# Patient Record
Sex: Female | Born: 1955
Health system: Southern US, Community
[De-identification: ages and names within clinical notes are randomized; demographics above are authoritative.]

## PROBLEM LIST (undated history)

## (undated) DIAGNOSIS — Z8541 Personal history of malignant neoplasm of cervix uteri: Secondary | ICD-10-CM

## (undated) DIAGNOSIS — M2550 Pain in unspecified joint: Secondary | ICD-10-CM

## (undated) DIAGNOSIS — R202 Paresthesia of skin: Secondary | ICD-10-CM

## (undated) DIAGNOSIS — M5116 Intervertebral disc disorders with radiculopathy, lumbar region: Secondary | ICD-10-CM

## (undated) DIAGNOSIS — G8929 Other chronic pain: Secondary | ICD-10-CM

## (undated) DIAGNOSIS — M5136 Other intervertebral disc degeneration, lumbar region: Secondary | ICD-10-CM

## (undated) DIAGNOSIS — M5126 Other intervertebral disc displacement, lumbar region: Secondary | ICD-10-CM

## (undated) DIAGNOSIS — E785 Hyperlipidemia, unspecified: Secondary | ICD-10-CM

## (undated) DIAGNOSIS — R200 Anesthesia of skin: Secondary | ICD-10-CM

## (undated) HISTORY — DX: Personal history of malignant neoplasm of cervix uteri: Z85.41

## (undated) HISTORY — PX: EYE SURGERY: SHX253

## (undated) HISTORY — DX: Pain in unspecified joint: M25.50

## (undated) HISTORY — DX: Other chronic pain: G89.29

## (undated) HISTORY — PX: HAND SURGERY: SHX662

## (undated) HISTORY — DX: Anesthesia of skin: R20.0

## (undated) HISTORY — DX: Paresthesia of skin: R20.2

## (undated) HISTORY — DX: Hyperlipidemia, unspecified: E78.5

## (undated) HISTORY — DX: Intervertebral disc disorders with radiculopathy, lumbar region: M51.16

---
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
## 1986-04-23 DIAGNOSIS — Z8541 Personal history of malignant neoplasm of cervix uteri: Secondary | ICD-10-CM

## 1986-04-23 HISTORY — DX: Personal history of malignant neoplasm of cervix uteri: Z85.41

## 2009-06-22 ENCOUNTER — Encounter: Admission: RE | Admit: 2009-06-22 | Discharge: 2009-06-22 | Payer: Self-pay | Admitting: Obstetrics and Gynecology

## 2009-08-04 ENCOUNTER — Encounter (INDEPENDENT_AMBULATORY_CARE_PROVIDER_SITE_OTHER): Payer: Self-pay | Admitting: *Deleted

## 2009-09-02 ENCOUNTER — Ambulatory Visit: Payer: Self-pay | Admitting: Internal Medicine

## 2009-10-28 ENCOUNTER — Ambulatory Visit: Payer: Self-pay | Admitting: Internal Medicine

## 2010-03-31 LAB — HM MAMMOGRAPHY

## 2010-03-31 LAB — HM PAP SMEAR

## 2010-05-14 ENCOUNTER — Encounter: Payer: Self-pay | Admitting: Obstetrics and Gynecology

## 2010-05-23 NOTE — Letter (Signed)
Summary: Referral - not able to see patient  [facility]  [address] [place], [place] [address]   Phone: [phone]  Fax: [phone]        August 04, 2009    [facility] Waynard Reeds, M.D. [address] [place], [place] [address]    Re:   Kim Edwards DOB:  26-Apr-1955 MRN:   [id number]    Dear Dr. Tenny Craw:  Thank you for your kind referral of the above patient.  We have attempted to schedule the recommended procedure Screening Colonoscopy but have not been able to schedule because:   X  The patient was not available by phone and/or has not returned our calls.  ___ The patient declined to schedule the procedure at this time.  We appreciate the referral and hope that we will have the opportunity to treat this patient in the future.    Sincerely,    [facility] [phone]

## 2010-05-24 ENCOUNTER — Other Ambulatory Visit: Payer: Self-pay | Admitting: Family Medicine

## 2010-05-24 ENCOUNTER — Encounter: Payer: Self-pay | Admitting: Family Medicine

## 2010-05-24 ENCOUNTER — Ambulatory Visit: Admit: 2010-05-24 | Payer: Self-pay | Admitting: Family Medicine

## 2010-05-24 ENCOUNTER — Ambulatory Visit (INDEPENDENT_AMBULATORY_CARE_PROVIDER_SITE_OTHER): Payer: 59 | Admitting: Family Medicine

## 2010-05-24 ENCOUNTER — Telehealth (INDEPENDENT_AMBULATORY_CARE_PROVIDER_SITE_OTHER): Payer: Self-pay | Admitting: *Deleted

## 2010-05-24 DIAGNOSIS — N959 Unspecified menopausal and perimenopausal disorder: Secondary | ICD-10-CM

## 2010-05-24 DIAGNOSIS — Z8541 Personal history of malignant neoplasm of cervix uteri: Secondary | ICD-10-CM | POA: Insufficient documentation

## 2010-05-24 DIAGNOSIS — F329 Major depressive disorder, single episode, unspecified: Secondary | ICD-10-CM

## 2010-05-24 DIAGNOSIS — E785 Hyperlipidemia, unspecified: Secondary | ICD-10-CM | POA: Insufficient documentation

## 2010-05-24 DIAGNOSIS — Z0000 Encounter for general adult medical examination without abnormal findings: Secondary | ICD-10-CM

## 2010-05-24 DIAGNOSIS — F419 Anxiety disorder, unspecified: Secondary | ICD-10-CM | POA: Insufficient documentation

## 2010-05-24 DIAGNOSIS — F411 Generalized anxiety disorder: Secondary | ICD-10-CM

## 2010-05-24 LAB — LIPID PANEL
Cholesterol: 239 mg/dL — ABNORMAL HIGH (ref 0–200)
HDL: 65.1 mg/dL (ref >39.00)
Triglycerides: 403 mg/dL — ABNORMAL HIGH (ref 0.0–149.0)

## 2010-05-24 LAB — LDL CHOLESTEROL, DIRECT: Direct LDL: 133.9 mg/dL

## 2010-05-24 LAB — HEPATIC FUNCTION PANEL
ALT: 16 U/L (ref 0–35)
Albumin: 3.9 g/dL (ref 3.5–5.2)
Bilirubin, Direct: 0.1 mg/dL (ref 0.0–0.3)

## 2010-05-25 ENCOUNTER — Encounter: Payer: Self-pay | Admitting: Family Medicine

## 2010-05-26 ENCOUNTER — Telehealth (INDEPENDENT_AMBULATORY_CARE_PROVIDER_SITE_OTHER): Payer: Self-pay | Admitting: *Deleted

## 2010-05-31 NOTE — Assessment & Plan Note (Signed)
Summary: NEW TO EST/PH/CONSULT   Vital Signs:  Patient profile:   55 year old female Height:      62 inches Weight:      150 pounds BMI:     27.53 Pulse rate:   66 / minute BP sitting:   100 / 60  (left arm)  Vitals Entered By: Doristine Devoid CMA (May 24, 2010 10:40 AM) CC: NEW EST- refill on med    History of Present Illness: 55 yo woman here today to establish care.  previous MD- [facility]  GYN- Ross  Hyperlipidemia- had blood work done for recent life insurance policy and found counts were 'sky high'.  was started on Lipitor 40mg , has run out of meds.  tolerated statin w/out difficulty- denies abd pain, N/V, myalgias.  menopausal- LMP 4 months ago.  having hot flashes- but these have improved in last few months.  'i'm crazy w/ the emotional stuff'.  feeling overwhelmed w/ work, Public affairs consultant, responsibilities.  decreased sex drive.  'short fuse'.  intermittantly tearful.  denies withdrawing from people- 'i just don't have time'.  not sleeping well.  denies SI/HI.  + fatigue.  some palpitations when overwhelmed but denies SOB.  Preventive Screening-Counseling & Management  Alcohol-Tobacco     Alcohol drinks/day: 1     Alcohol type: wine     Smoking Status: quit  Caffeine-Diet-Exercise     Does Patient Exercise: no      Sexual History:  currently monogamous.        Drug Use:  never.    Current Medications (verified): 1)  Lipitor 40 Mg Tabs (Atorvastatin Calcium) .... Take One Tablet Daliy  Allergies (verified): 1)  ! Percocet  Past History:  Past Medical History: Hyperlipidemia Cervical cancer, hx of  Past Surgical History: cervical cancer  Family History: CAD-mother quad bypass-pacemaker,father-bypass X2 HTN-no DM-mother,father STROKE-no COLON CA-no BREAST CA-no Hyperlipidemia- brother, sister  Social History: lesbian, 3 children (2009, 1999, 65) Little Therapist, nutritional- runs [employer] briefly smoked in past, not current wine dailySmoking Status:   quit Does Patient Exercise:  no Sexual History:  currently monogamous Drug Use:  never  Review of Systems      See HPI  Physical Exam  General:  Well-developed,well-nourished,in no acute distress; alert,appropriate and cooperative throughout examination Head:  Normocephalic and atraumatic without obvious abnormalities. No apparent alopecia or balding. Eyes:  PERRL, EOMI Neck:  No deformities, masses, or tenderness noted. Lungs:  Normal respiratory effort, chest expands symmetrically. Lungs are clear to auscultation, no crackles or wheezes. Heart:  Normal rate and regular rhythm. S1 and S2 normal without gallop, murmur, click, rub or other extra sounds. Abdomen:  soft, NT/ND, +BS Pulses:  +2 carotid, radial, DP Extremities:  no C/C/E Neurologic:  alert & oriented X3, cranial nerves II-XII intact, gait normal, and DTRs symmetrical and normal.   Skin:  turgor normal and color normal.   Cervical Nodes:  No lymphadenopathy noted Psych:  rapid speech, linear thought process, anxious but not depressed appearing   Impression & Recommendations:  Problem # 1:  HYPERLIPIDEMIA (ICD-272.4) Assessment New check labs to assess starting point.  may need to adjust meds based on levels.  tolerated statin w/out difficulty. Her updated medication list for this problem includes:    Lipitor 40 Mg Tabs (Atorvastatin calcium) .Marland Kitchen... Take one tablet daliy  Orders: Venipuncture (04540) Specimen Handling (98119) TLB-Lipid Panel (80061-LIPID) TLB-Hepatic/Liver Function Pnl (80076-HEPATIC)  Problem # 2:  PERIMENOPAUSAL SYNDROME (ICD-627.9) Assessment: New pt having perimenopausal sxs.  not officially menopausal since LMP 4 months ago.  start Effexor to control both hot flashes and 'emotional rollar coaster'.  Problem # 3:  ANXIETY STATE, UNSPECIFIED (ICD-300.00) Assessment: New pt having difficulty maintaining balance between work and home and is feeling overwhelmed.  this is weighing her down and  causing fatigue.  start SNRI to improve sxs of fatigue and to minimize sexual dysfxn as pt reports low libido.  will follow closely. Her updated medication list for this problem includes:    Venlafaxine Hcl 37.5 Mg Xr24h-tab (Venlafaxine hcl) .Marland Kitchen... 1 tab by mouth daily x2 weeks and then increase to 2 tabs daily  Complete Medication List: 1)  Lipitor 40 Mg Tabs (Atorvastatin calcium) .... Take one tablet daliy 2)  Venlafaxine Hcl 37.5 Mg Xr24h-tab (Venlafaxine hcl) .Marland Kitchen.. 1 tab by mouth daily x2 weeks and then increase to 2 tabs daily  Patient Instructions: 1)  Follow up in 1 month to recheck mood 2)  Start the Venlafaxine daily for the mood 3)  Restart the lipitor 4)  We'll notify you of your lab results 5)  Call with any questions or concerns 6)  Welcome!  We're glad to have you! Prescriptions: LIPITOR 40 MG TABS (ATORVASTATIN CALCIUM) take one tablet daliy  #30 x 6   Entered and Authorized by:   Neena Rhymes MD   Signed by:   Neena Rhymes MD on 05/24/2010   Method used:   [facility]  [address] [id number]       [address]       [place], [place]  [address]       Ph: [phone] or [phone]       Fax: [phone]   RxID:   [id number] VENLAFAXINE HCL 37.5 MG XR24H-TAB (VENLAFAXINE HCL) 1 tab by mouth daily x2 weeks and then increase to 2 tabs daily  #60 x 1   Entered and Authorized by:   Neena Rhymes MD   Signed by:   Neena Rhymes MD on 05/24/2010   Method used:   [facility]        [facility]  [address] [id number]       [address]       [place], [place]  [address]       Ph: [phone] or [phone]       Fax: [phone]   RxID:   [id number]    Orders Added: 1)  Venipuncture [37628] 2)  Specimen Handling [99000] 3)  TLB-Lipid Panel [80061-LIPID] 4)  TLB-Hepatic/Liver Function Pnl [80076-HEPATIC] 5)  New Patient Level III [31517]

## 2010-06-03 ENCOUNTER — Encounter: Payer: Self-pay | Admitting: Family Medicine

## 2010-06-08 NOTE — Progress Notes (Signed)
Summary: Prior Auth- Denied-MED CHANGED TO [facility] XR-[facility]  Phone Note Refill Request Call back at [phone] Message from:  Pharmacy on May 24, 2010 11:23 AM  Refills Requested: Medication #1:  VENLAFAXINE HCL 37.5 MG XR24H-TAB 1 tab by mouth daily x2 weeks and then increase to 2 tabs daily.   Dosage confirmed as above?Dosage Confirmed [facility] [address]   Initial call taken by: Harold Barban,  May 24, 2010 11:23 AM  Follow-up for Phone Call        awaiting fax............Marland KitchenFelecia Deloach CMA  May 24, 2010 3:31 PM   Prior auth faxed awaitng response......Marland KitchenFelecia Deloach CMA  May 25, 2010 5:01 PM   Additional Follow-up for Phone Call Additional follow up Details #1::        Prior auth denied coverage is proved insituations where we are unable to approve your request for coverage of VENLAFAXINE HCL. formulary alternative VENLAFAXINE ER or Effexor XR......Marland KitchenFelecia Deloach CMA  May 29, 2010 11:28 AM     Additional Follow-up for Phone Call Additional follow up Details #2::    switch to Effexor XR 37.5mg - same directions. Follow-up by: Neena Rhymes MD,  May 29, 2010 12:08 PM  Additional Follow-up for Phone Call Additional follow up Details #3:: Details for Additional Follow-up Action Taken: left message on machine ....Marland KitchenMarland KitchenDoristine Devoid CMA  May 30, 2010 2:48 PM   Pt aware of med change and Rx sent to pharmacy..........Marland KitchenFelecia Deloach CMA  May 30, 2010 3:43 PM   New/Updated Medications: EFFEXOR XR 37.5 MG XR24H-CAP (VENLAFAXINE HCL) Take 1 tab by mouth daily x2 weeks and then increase to 2 tabs daily Prescriptions: EFFEXOR XR 37.5 MG XR24H-CAP (VENLAFAXINE HCL) Take 1 tab by mouth daily x2 weeks and then increase to 2 tabs daily  #60 x 1   Entered by:   Jeremy Johann CMA   Authorized by:   Neena Rhymes MD   Signed by:   Jeremy Johann CMA on 05/30/2010   Method used:   [facility] ...       [facility]  [address]  [id number]       [address]       [place], [place]  [address]       Ph: [phone] or [phone]       Fax: [phone]   RxID:   [id number]

## 2010-06-08 NOTE — Progress Notes (Signed)
Summary: labs-lmom  Phone Note Outgoing Call   Call placed by: Doristine Devoid CMA,  May 26, 2010 8:40 AM Call placed to: Patient Summary of Call: triglycerides are very high.  will need to add Fenofibrate 160mg  daily to improve this.  should continue Lipitor 40mg  nightly.  recheck LFTs at next visit after adding Fenofibrate.  Follow-up for Phone Call        left message on machine .......Marland KitchenDoristine Devoid CMA  May 26, 2010 8:40 AM   Discuss with patient...Marland KitchenMarland KitchenFelecia Deloach CMA  May 29, 2010 10:50 AM     New/Updated Medications: FENOFIBRATE 160 MG TABS (FENOFIBRATE) Take 1 tab daily Prescriptions: FENOFIBRATE 160 MG TABS (FENOFIBRATE) Take 1 tab daily  #30 x 2   Entered by:   Jeremy Johann CMA   Authorized by:   Neena Rhymes MD   Signed by:   Jeremy Johann CMA on 05/29/2010   Method used:   [facility] ...       [facility]  [address] [id number]       [address]       [place], [place]  [address]       Ph: [phone] or [phone]       Fax: [phone]   RxID:   [id number]

## 2010-06-14 NOTE — Medication Information (Signed)
Summary: Prior Authorization Request for Venlafaxine Hcl Er  Prior Authorization Request for Venlafaxine Hcl Er   Imported By: Maryln Gottron 06/08/2010 11:00:01  _____________________________________________________________________  External Attachment:    Type:   Image     Comment:   External Document

## 2010-06-20 NOTE — Medication Information (Signed)
Summary: Venlafaxine Hcl Denied  Venlafaxine Hcl Denied   Imported By: Maryln Gottron 06/13/2010 13:57:02  _____________________________________________________________________  External Attachment:    Type:   Image     Comment:   External Document

## 2010-06-22 ENCOUNTER — Ambulatory Visit (INDEPENDENT_AMBULATORY_CARE_PROVIDER_SITE_OTHER): Payer: 59 | Admitting: Family Medicine

## 2010-06-22 ENCOUNTER — Other Ambulatory Visit: Payer: Self-pay | Admitting: Family Medicine

## 2010-06-22 ENCOUNTER — Telehealth (INDEPENDENT_AMBULATORY_CARE_PROVIDER_SITE_OTHER): Payer: Self-pay | Admitting: *Deleted

## 2010-06-22 ENCOUNTER — Encounter: Payer: Self-pay | Admitting: Family Medicine

## 2010-06-22 DIAGNOSIS — E785 Hyperlipidemia, unspecified: Secondary | ICD-10-CM

## 2010-06-22 DIAGNOSIS — M25519 Pain in unspecified shoulder: Secondary | ICD-10-CM

## 2010-06-22 DIAGNOSIS — F411 Generalized anxiety disorder: Secondary | ICD-10-CM

## 2010-06-22 LAB — HEPATIC FUNCTION PANEL
ALT: 43 U/L — ABNORMAL HIGH (ref 0–35)
AST: 50 U/L — ABNORMAL HIGH (ref 0–37)
Albumin: 4.2 g/dL (ref 3.5–5.2)
Alkaline Phosphatase: 25 U/L — ABNORMAL LOW (ref 39–117)
Bilirubin, Direct: 0.1 mg/dL (ref 0.0–0.3)
Total Bilirubin: 0.6 mg/dL (ref 0.3–1.2)
Total Protein: 6.9 g/dL (ref 6.0–8.3)

## 2010-06-28 ENCOUNTER — Telehealth (INDEPENDENT_AMBULATORY_CARE_PROVIDER_SITE_OTHER): Payer: Self-pay | Admitting: *Deleted

## 2010-06-28 ENCOUNTER — Ambulatory Visit: Payer: 59 | Admitting: Family Medicine

## 2010-06-29 NOTE — Assessment & Plan Note (Signed)
Summary: 1 month f/u/cbs   Vital Signs:  Patient profile:   55 year old female Height:      62 inches (157.48 cm) Weight:      150.25 pounds (68.30 kg) BMI:     27.58 Temp:     98.5 degrees F (36.94 degrees C) oral BP sitting:   126 / 80  (left arm) Cuff size:   regular  Vitals Entered By: Lucious Groves CMA (June 22, 2010 10:55 AM) CC: 1 mo. followup./kb   History of Present Illness: 55 yo woman here today for f/u on mood.  insurance never approved the Effexor so never started med.  Hyperlipidemia- doing well on lipitor and fenofibrate.  no N/V, abd pain, myalgias.  bilateral shoulder pain- L>R, having difficulty lying on L shoulder due to pain.  improves w/ tylenol.  + family hx of arthritis. will also have pain in L elbow and occasionally L wrist.  been present for over a year but really noticeable last 3-4 months.  Current Medications (verified): 1)  Lipitor 40 Mg Tabs (Atorvastatin Calcium) .... Take One Tablet Daliy 2)  Effexor Xr 37.5 Mg Xr24h-Cap (Venlafaxine Hcl) .... Take 1 Tab By Mouth Daily X2 Weeks and Then Increase To 2 Tabs Daily 3)  Fenofibrate 160 Mg Tabs (Fenofibrate) .... Take 1 Tab Daily  Allergies (verified): 1)  ! Percocet  Review of Systems      See HPI  Physical Exam  General:  Well-developed,well-nourished,in no acute distress; alert,appropriate and cooperative throughout examination Lungs:  Normal respiratory effort, chest expands symmetrically. Lungs are clear to auscultation, no crackles or wheezes. Heart:  Normal rate and regular rhythm. S1 and S2 normal without gallop, murmur, click, rub or other extra sounds. Abdomen:  soft, NT/ND, +BS Msk:  + TTP over L biceps head, pain w/ suppination, good ROM of L shoulder Pulses:  +2 radial, ulnar Extremities:  no C/C/E   Impression & Recommendations:  Problem # 1:  ANXIETY STATE, UNSPECIFIED (ICD-300.00) Assessment Unchanged never started meds.  prescription re-sent.  pt told to call today if there  was a problem getting meds. Her updated medication list for this problem includes:    Effexor Xr 37.5 Mg Xr24h-cap (Venlafaxine hcl) .Marland Kitchen... Take 1 tab by mouth daily x2 weeks and then increase to 2 tabs daily  Problem # 2:  HYPERLIPIDEMIA (ICD-272.4) Assessment: Unchanged doing well on current meds.  recheck LFTs Her updated medication list for this problem includes:    Lipitor 40 Mg Tabs (Atorvastatin calcium) .Marland Kitchen... Take one tablet daliy    Fenofibrate 160 Mg Tabs (Fenofibrate) .Marland Kitchen... Take 1 tab daily  Orders: Venipuncture (29562) TLB-Hepatic/Liver Function Pnl (80076-HEPATIC) Specimen Handling (13086)  Problem # 3:  SHOULDER, PAIN (ICD-719.41) Assessment: New  continue NSAIDs.  refer to sports med as this has been progressively worsening.  Orders: Sports Medicine (Sports Med)  Complete Medication List: 1)  Lipitor 40 Mg Tabs (Atorvastatin calcium) .... Take one tablet daliy 2)  Effexor Xr 37.5 Mg Xr24h-cap (Venlafaxine hcl) .... Take 1 tab by mouth daily x2 weeks and then increase to 2 tabs daily 3)  Fenofibrate 160 Mg Tabs (Fenofibrate) .... Take 1 tab daily  Patient Instructions: 1)  We'll notify you of your sports med appt 2)  Continue the tylenol or ibuprofen/aleve as needed for pain 3)  Continue the cholesterol meds- we'll notify you of your labs 4)  Please call me if Effexor is not available 5)  Call with any questions or concerns 6)  Happy Spring!!! Prescriptions: EFFEXOR XR 37.5 MG XR24H-CAP (VENLAFAXINE HCL) Take 1 tab by mouth daily x2 weeks and then increase to 2 tabs daily  #60 x 1   Entered and Authorized by:   Neena Rhymes MD   Signed by:   Neena Rhymes MD on 06/22/2010   Method used:   [facility]        [facility]  [address] [id number]       [address]       [place], [place]  [address]       Ph: [phone] or [phone]       Fax: [phone]   RxID:   [id number]    Orders Added: 1)  Venipuncture  [34742] 2)  TLB-Hepatic/Liver Function Pnl [80076-HEPATIC] 3)  Specimen Handling [99000] 4)  Est. Patient Level III [59563] 5)  Sports Medicine [Sports Med]

## 2010-07-04 NOTE — Progress Notes (Signed)
Summary: No prior auth needed  [facility]  Phone Note Refill Request Message from:  Fax from Pharmacy on June 28, 2010 3:25 PM  Refills Requested: Medication #1:  EFFEXOR XR 37.5 MG XR24H-CAP Take 1 tab by mouth daily prior auth - [id number] - id [id number]  Initial call taken by: Okey Regal [place],  June 28, 2010 3:26 PM  Follow-up for Phone Call        Spoke to pharmacy paid claim today for med so we can disregard fax......Marland KitchenFelecia Deloach CMA  June 28, 2010 4:31 PM

## 2010-07-04 NOTE — Progress Notes (Signed)
Summary: --Venlafaxine refill---needs prior auth  Phone Note Refill Request Message from:  Fax from Pharmacy on June 22, 2010 3:46 PM  Refills Requested: Medication #1:  EFFEXOR XR 37.5 MG XR24H-CAP Take 1 tab by mouth daily x2 weeks and then increase to 2 tabs daily   Dosage confirmed as above?[facility]irmed [id number] [address] [place], [place]  [address]      phone = [phone]   fax = [phone]    qty = 60     needs prior auth-- [phone], patient ID# [id number]  Next Appointment Scheduled: none Initial call taken by: Jerolyn Shin,  June 22, 2010 3:48 PM  Follow-up for Phone Call        Per insurance 37.5 mg is only covered for once daily dosing. If Pt is needed more a appeal will need to be submitted for quantity override. However 75 mg is covered for up to 3 tabs daily dosing. Pls advise if ok to change med or appeal is appropriate.......Marland KitchenFelecia Deloach CMA  June 26, 2010 9:16 AM   Additional Follow-up for Phone Call Additional follow up Details #1::        37.5mg  daily x30 w/ 3 refills.  should schedule an appt in 1 month to check on mood.  if mood is no better will increase to 75mg  tabs.  this should avoid prior auth. Additional Follow-up by: Neena Rhymes MD,  June 26, 2010 9:29 AM    Additional Follow-up for Phone Call Additional follow up Details #2::    Left message to call office.............Marland KitchenFelecia Deloach CMA  June 26, 2010 10:00 AM  Pt aware appt scheduled..........Marland KitchenFelecia Deloach CMA  June 26, 2010 12:41 PM   New/Updated Medications: EFFEXOR XR 37.5 MG XR24H-CAP (VENLAFAXINE HCL) Take 1 tab by mouth daily Prescriptions: EFFEXOR XR 37.5 MG XR24H-CAP (VENLAFAXINE HCL) Take 1 tab by mouth daily  #30 x 3   Entered by:   Jeremy Johann CMA   Authorized by:   Neena Rhymes MD   Signed by:   Jeremy Johann CMA on 06/26/2010   Method used:   [facility] ...       [facility]  [address] [id number]       [address]       [place], [place]  [address]       Ph: [phone] or [phone]       Fax: [phone]   RxID:   [id number]

## 2010-07-26 ENCOUNTER — Encounter: Payer: Self-pay | Admitting: Family Medicine

## 2010-07-26 ENCOUNTER — Ambulatory Visit (INDEPENDENT_AMBULATORY_CARE_PROVIDER_SITE_OTHER): Payer: 59 | Admitting: Family Medicine

## 2010-07-26 ENCOUNTER — Ambulatory Visit: Payer: 59 | Admitting: Family Medicine

## 2010-07-26 VITALS — BP 113/78 | HR 69 | Temp 97.4°F | Ht 62.0 in | Wt 150.2 lb

## 2010-07-26 VITALS — BP 100/70 | Temp 98.1°F | Ht 62.0 in | Wt 150.5 lb

## 2010-07-26 DIAGNOSIS — M25519 Pain in unspecified shoulder: Secondary | ICD-10-CM

## 2010-07-26 DIAGNOSIS — R7989 Other specified abnormal findings of blood chemistry: Secondary | ICD-10-CM | POA: Insufficient documentation

## 2010-07-26 DIAGNOSIS — R945 Abnormal results of liver function studies: Secondary | ICD-10-CM

## 2010-07-26 DIAGNOSIS — F411 Generalized anxiety disorder: Secondary | ICD-10-CM

## 2010-07-26 LAB — HEPATIC FUNCTION PANEL
ALT: 37 U/L — ABNORMAL HIGH (ref 0–35)
Total Protein: 6.7 g/dL (ref 6.0–8.3)

## 2010-07-26 MED ORDER — BUSPIRONE HCL 15 MG PO TABS: 15.0000 mg | ORAL_TABLET | Freq: Two times a day (BID) | ORAL | Status: AC

## 2010-07-26 NOTE — Progress Notes (Signed)
  Subjective:    Patient ID: Kim Edwards, female    DOB: 08-08-1955, 55 y.o.   MRN: [id number]  HPI Anxiety state- w/in 1 week was unable to have orgasm.  Stopped meds after 4 doses.  Still struggling w/ anxiety and very stressful.  Attempting to exercise 2-3x/week.  Abnormal LFTs- mildly elevated last check.  Repeat today to trend.   Review of Systems For ROS see HPI     Objective:   Physical Exam  Constitutional: She appears well-developed and well-nourished. No distress.  Psychiatric: Her speech is normal and behavior is normal. Judgment and thought content normal. Her mood appears not anxious. Her affect is not angry, not blunt, not labile and not inappropriate. Cognition and memory are normal. She does not exhibit a depressed mood.          Assessment & Plan:

## 2010-07-26 NOTE — Patient Instructions (Signed)
You have rotator cuff impingement Try to avoid painful activities (overhead activities, lifting with extended arm) as much as possible. Aleve 2 tabs twice a day with food x 7 days then as needed for pain Subacromial injection may be beneficial to help with pain and to decrease inflammation. Start physical therapy - they will work around your schedule to go 1-2 times a week up to 6 weeks. Do home exercises regularly as directed by them. If not improving at follow-up we will consider injection and/or further imaging. Follow up with me in 6 weeks or as needed.

## 2010-07-26 NOTE — Assessment & Plan Note (Signed)
Pt did not tolerate SNRI due to anorgasmia.  Start Buspar to control anxiety- pt to call if side effects.

## 2010-07-26 NOTE — Patient Instructions (Signed)
Start the Buspar- take 1/2 tab twice daily for 1-2 weeks and then increase to 1 tab twice daily for the anxiety We'll notify you of your lab results Keep up the good work!  You look great! Call with any questions or concerns Happy Spring!!!

## 2010-07-26 NOTE — Assessment & Plan Note (Signed)
Check labs to trend 

## 2010-07-27 ENCOUNTER — Encounter: Payer: Self-pay | Admitting: Family Medicine

## 2010-07-27 NOTE — Progress Notes (Signed)
  Subjective:    Patient ID: Kim Edwards, female    DOB: Aug 16, 1955, 55 y.o.   MRN: [id number]  HPI 55 yo F here with left shoulder pain  Patient reports pain started about 9 months ago No known injury Worse with reaching across body and behind back as well as overhead activities. Works in [employer] and does a lot of lifting Is left handed Tried ibuprofen but not much else + Night pain when lying on this shoulder Not tried ice/heat, PT, exercises.  Past Medical History  Diagnosis Date  . Hyperlipidemia   . History of cervical cancer     Current Outpatient Prescriptions on File Prior to Visit  Medication Sig Dispense Refill  . atorvastatin (LIPITOR) 40 MG tablet Take 40 mg by mouth daily.        . fenofibrate 160 MG tablet Take 160 mg by mouth daily.          History reviewed. No pertinent past surgical history.  Allergies  Allergen Reactions  . Oxycodone-Acetaminophen     REACTION: upset stomach    History   Social History  . Marital Status: Single    Spouse Name: N/A    Number of Children: N/A  . Years of Education: N/A   Occupational History  . Not on file.   Social History Main Topics  . Smoking status: Former Smoker    Quit date: 04/23/2000  . Smokeless tobacco: Not on file  . Alcohol Use: Yes     1-1.5 glasses of wine daily  . Drug Use: No  . Sexually Active:    Other Topics Concern  . Not on file   Social History Narrative  . No narrative on file    Family History  Problem Relation Age of Onset  . Heart disease Mother     quad bypass  . Diabetes Mother   . Heart attack Mother   . Heart disease Father     bypass x2  . Diabetes Father   . Heart attack Father   . Hypertension Father     BP 113/78  Pulse 69  Temp(Src) 97.4 F (36.3 C) (Oral)  Ht 5\' 2"  (1.575 m)  Wt 150 lb 3.2 oz (68.13 kg)  BMI 27.47 kg/m2  Review of Systems See HPI above.    Objective:   Physical Exam Gen: NAD L shoulder: No Gross deformity,  swelling, or bruising. Mild TTP biceps tendon.  No other TTp about shoulder. FROM with mild painful arc Strength 5/5 with empty can, resisted IR/ER + hawkins, equivocal neers Negative speeds and yergasons Negative apprehension. NVI distally.  R shoulder: FROM without pain or weakness.     Assessment & Plan:  55 yo F with 1. Left shoulder pain - 2/2 rotator cuff impingement.  Discussed options.  Will move forward with PT/HEP + NSAIDs.  Try cortisone injection if pain does not improve over the next 4-6 weeks.  Ultrasound also an option though doubt partial rotator cuff tear based on her exam and history.  See instructions for further.

## 2010-07-27 NOTE — Assessment & Plan Note (Signed)
2/2 rotator cuff impingement.  Discussed options.  Will move forward with PT/HEP + NSAIDs.  Try cortisone injection if pain does not improve over the next 4-6 weeks.  Ultrasound also an option though doubt partial rotator cuff tear based on her exam and history.  See instructions for further.

## 2010-08-15 ENCOUNTER — Telehealth: Payer: Self-pay | Admitting: *Deleted

## 2010-08-15 NOTE — Telephone Encounter (Signed)
Left msg for return call

## 2010-08-15 NOTE — Telephone Encounter (Signed)
Spoke w/ pt says she is not going to try anything else at this time since she is so sensitive to medication.

## 2010-08-28 ENCOUNTER — Other Ambulatory Visit: Payer: Self-pay | Admitting: Family Medicine

## 2010-09-29 ENCOUNTER — Telehealth: Payer: Self-pay | Admitting: *Deleted

## 2010-09-29 NOTE — Telephone Encounter (Signed)
Pt c/o muscle ache, pain and tenderness. Pt notes that she feel reaction is related to fenofibrate. Pt states that she was having ache while on Lipitor but was tolerable now since starting fenofibrate symptoms have increased. Pt has since stop both med and symptoms have resolve. Please advise

## 2010-09-29 NOTE — Telephone Encounter (Signed)
Will note that she can't take both meds together but she really should take the Lipitor based on her cholesterol results.  Can stop the the fenofibrate

## 2010-09-29 NOTE — Telephone Encounter (Signed)
Discuss with patient  

## 2011-01-15 ENCOUNTER — Other Ambulatory Visit: Payer: Self-pay | Admitting: Family Medicine

## 2011-02-12 ENCOUNTER — Other Ambulatory Visit: Payer: Self-pay | Admitting: Family Medicine

## 2011-02-12 NOTE — Telephone Encounter (Signed)
Patient needs refill [facility]itor - [facility] [address]

## 2011-02-13 MED ORDER — ATORVASTATIN CALCIUM 40 MG PO TABS: 40.0000 mg | ORAL_TABLET | Freq: Every day | ORAL | Status: DC

## 2011-02-13 NOTE — Telephone Encounter (Signed)
rx sent

## 2011-02-16 ENCOUNTER — Telehealth: Payer: Self-pay | Admitting: *Deleted

## 2011-02-16 NOTE — Telephone Encounter (Signed)
Spoke w/pt - she is worried about Lipitor RX, received generic form but pt has 4 $ Lipitor card. Advised patient that she could ask for brand name from pharm so she could use the 4$ card.

## 2011-05-23 ENCOUNTER — Other Ambulatory Visit: Payer: Self-pay | Admitting: Family Medicine

## 2011-05-23 MED ORDER — ATORVASTATIN CALCIUM 40 MG PO TABS: 40.0000 mg | ORAL_TABLET | Freq: Every day | ORAL | Status: DC

## 2011-05-23 NOTE — Telephone Encounter (Signed)
Pt called to advise that she is still a pt of MD [facility], per verbal order from MD we can fill lipitor for 3 months til she can get an appt, pt will look at schedule and call back to make an upcoming appt rx sent to pharmacy by e-script for lipitor

## 2011-05-23 NOTE — Telephone Encounter (Signed)
Noted PCP change on 03-2011 unable to locate the current PCP to send [facility], left message for pt to call office to please advise

## 2011-06-01 ENCOUNTER — Telehealth: Payer: Self-pay | Admitting: Family Medicine

## 2011-06-01 MED ORDER — ATORVASTATIN CALCIUM 40 MG PO TABS: 40.0000 mg | ORAL_TABLET | Freq: Every day | ORAL | Status: DC

## 2011-06-01 NOTE — Telephone Encounter (Signed)
Refill: Lipitor 40 mg tablet. Take one tablet daily. Qty 30. Last fill 04-15-11

## 2011-06-01 NOTE — Telephone Encounter (Signed)
rx sent to pharmacy by e-script for #30 per pt overdue for CPE and labs Letter has been mailed to pt address noted in the chart to advise they are overdue for cpe/ov/labs and the pt needs to contact office to set up appt

## 2011-07-02 ENCOUNTER — Ambulatory Visit (INDEPENDENT_AMBULATORY_CARE_PROVIDER_SITE_OTHER): Payer: Managed Care, Other (non HMO) | Admitting: Family Medicine

## 2011-07-02 ENCOUNTER — Encounter: Payer: Self-pay | Admitting: Family Medicine

## 2011-07-02 DIAGNOSIS — N959 Unspecified menopausal and perimenopausal disorder: Secondary | ICD-10-CM

## 2011-07-02 DIAGNOSIS — E785 Hyperlipidemia, unspecified: Secondary | ICD-10-CM

## 2011-07-02 LAB — LIPID PANEL
Cholesterol: 307 mg/dL — ABNORMAL HIGH (ref 0–200)
Total CHOL/HDL Ratio: 5
VLDL: 89.2 mg/dL — ABNORMAL HIGH (ref 0.0–40.0)

## 2011-07-02 LAB — HEPATIC FUNCTION PANEL
ALT: 19 U/L (ref 0–35)
AST: 22 U/L (ref 0–37)
Bilirubin, Direct: 0.1 mg/dL (ref 0.0–0.3)
Total Bilirubin: 0.6 mg/dL (ref 0.3–1.2)

## 2011-07-02 LAB — BASIC METABOLIC PANEL
Calcium: 9.1 mg/dL (ref 8.4–10.5)
Chloride: 104 mEq/L (ref 96–112)
Creatinine, Ser: 0.9 mg/dL (ref 0.4–1.2)
Sodium: 137 mEq/L (ref 135–145)

## 2011-07-02 LAB — LDL CHOLESTEROL, DIRECT: Direct LDL: 172.8 mg/dL

## 2011-07-02 NOTE — Assessment & Plan Note (Signed)
Prior to running out of meds, pt was tolerating w/out difficulty.  Check labs.  Adjust meds prn.

## 2011-07-02 NOTE — Progress Notes (Signed)
  Subjective:    Patient ID: Kim Edwards, female    DOB: [birth year], 56 y.o.   MRN: [id number]  HPI Hyperlipidemia- chronic problem.  Has been out of Lipitor for 2 weeks.  Prior to stopping meds had no abd pain, N/V, myalgias.  Due for labs.  Hot flashes- 'they're driving me crazy'.  Has not tried OTC meds.  Not interested in hormone replacement.   Review of Systems For ROS see HPI     Objective:   Physical Exam  Constitutional: She is oriented to person, place, and time. She appears well-developed and well-nourished. No distress.  HENT:  Head: Normocephalic and atraumatic.  Eyes: Conjunctivae and EOM are normal. Pupils are equal, round, and reactive to light.  Neck: Normal range of motion. Neck supple. No thyromegaly present.  Cardiovascular: Normal rate, regular rhythm, normal heart sounds and intact distal pulses.   No murmur heard. Pulmonary/Chest: Effort normal and breath sounds normal. No respiratory distress.  Abdominal: Soft. She exhibits no distension. There is no tenderness.  Musculoskeletal: She exhibits no edema.  Lymphadenopathy:    She has no cervical adenopathy.  Neurological: She is alert and oriented to person, place, and time.  Skin: Skin is warm and dry.  Psychiatric: She has a normal mood and affect. Her behavior is normal.          Assessment & Plan:

## 2011-07-02 NOTE — Assessment & Plan Note (Signed)
Deteriorated.  Pt not interested in starting hormone replacement.  Pt to try black cohosh or estruven for symptom relief.  Pt expressed understanding and is in agreement w/ plan.

## 2011-07-02 NOTE — Patient Instructions (Signed)
Schedule your complete physical in 6 months You look great!  Keep up the good work! We'll notify you of your lab results and make any changes if needed Call with any questions or concerns [place]!!!

## 2011-07-04 ENCOUNTER — Other Ambulatory Visit: Payer: Self-pay | Admitting: *Deleted

## 2011-07-04 MED ORDER — ATORVASTATIN CALCIUM 40 MG PO TABS: 40.0000 mg | ORAL_TABLET | Freq: Every day | ORAL | Status: DC

## 2011-07-04 NOTE — Telephone Encounter (Signed)
Rx sent 

## 2011-08-16 ENCOUNTER — Encounter (HOSPITAL_BASED_OUTPATIENT_CLINIC_OR_DEPARTMENT_OTHER): Payer: Self-pay | Admitting: Emergency Medicine

## 2011-08-16 ENCOUNTER — Emergency Department (HOSPITAL_BASED_OUTPATIENT_CLINIC_OR_DEPARTMENT_OTHER)
Admission: EM | Admit: 2011-08-16 | Discharge: 2011-08-16 | Disposition: A | Discharge status: Home / Self Care | Payer: Managed Care, Other (non HMO) | Attending: Emergency Medicine | Admitting: Emergency Medicine

## 2011-08-16 ENCOUNTER — Emergency Department (INDEPENDENT_AMBULATORY_CARE_PROVIDER_SITE_OTHER): Payer: Managed Care, Other (non HMO)

## 2011-08-16 DIAGNOSIS — R079 Chest pain, unspecified: Secondary | ICD-10-CM | POA: Insufficient documentation

## 2011-08-16 DIAGNOSIS — E785 Hyperlipidemia, unspecified: Secondary | ICD-10-CM | POA: Insufficient documentation

## 2011-08-16 DIAGNOSIS — S139XXA Sprain of joints and ligaments of unspecified parts of neck, initial encounter: Secondary | ICD-10-CM | POA: Insufficient documentation

## 2011-08-16 DIAGNOSIS — M542 Cervicalgia: Secondary | ICD-10-CM | POA: Insufficient documentation

## 2011-08-16 DIAGNOSIS — S161XXA Strain of muscle, fascia and tendon at neck level, initial encounter: Secondary | ICD-10-CM

## 2011-08-16 MED ORDER — CYCLOBENZAPRINE HCL 10 MG PO TABS: 10.0000 mg | ORAL_TABLET | Freq: Two times a day (BID) | ORAL | Status: AC | PRN

## 2011-08-16 MED ORDER — IBUPROFEN 800 MG PO TABS
800.0000 mg | ORAL_TABLET | Freq: Once | ORAL | Status: AC
  Administered 2011-08-16: 800 mg via ORAL
  Filled 2011-08-16: qty 1
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
## 2011-08-16 NOTE — ED Notes (Signed)
n
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
## 2011-08-29 ENCOUNTER — Emergency Department (INDEPENDENT_AMBULATORY_CARE_PROVIDER_SITE_OTHER): Payer: Managed Care, Other (non HMO)

## 2011-08-29 ENCOUNTER — Emergency Department (HOSPITAL_BASED_OUTPATIENT_CLINIC_OR_DEPARTMENT_OTHER)
Admission: EM | Admit: 2011-08-29 | Discharge: 2011-08-29 | Disposition: A | Discharge status: Home / Self Care | Payer: Managed Care, Other (non HMO) | Attending: Emergency Medicine | Admitting: Emergency Medicine

## 2011-08-29 ENCOUNTER — Encounter (HOSPITAL_BASED_OUTPATIENT_CLINIC_OR_DEPARTMENT_OTHER): Payer: Self-pay | Admitting: *Deleted

## 2011-08-29 DIAGNOSIS — R079 Chest pain, unspecified: Secondary | ICD-10-CM

## 2011-08-29 DIAGNOSIS — M549 Dorsalgia, unspecified: Secondary | ICD-10-CM | POA: Insufficient documentation

## 2011-08-29 DIAGNOSIS — S299XXA Unspecified injury of thorax, initial encounter: Secondary | ICD-10-CM

## 2011-08-29 DIAGNOSIS — Z8541 Personal history of malignant neoplasm of cervix uteri: Secondary | ICD-10-CM | POA: Insufficient documentation

## 2011-08-29 DIAGNOSIS — R059 Cough, unspecified: Secondary | ICD-10-CM | POA: Insufficient documentation

## 2011-08-29 DIAGNOSIS — E785 Hyperlipidemia, unspecified: Secondary | ICD-10-CM | POA: Insufficient documentation

## 2011-08-29 DIAGNOSIS — S298XXA Other specified injuries of thorax, initial encounter: Secondary | ICD-10-CM | POA: Insufficient documentation

## 2011-08-29 DIAGNOSIS — R05 Cough: Secondary | ICD-10-CM | POA: Insufficient documentation

## 2011-08-29 LAB — CBC
HCT: 37.3 % (ref 36.0–46.0)
Hemoglobin: 13.1 g/dL (ref 12.0–15.0)
MCH: 31.6 pg (ref 26.0–34.0)
MCHC: 35.1 g/dL (ref 30.0–36.0)
MCV: 89.9 fL (ref 78.0–100.0)
Platelets: 249 K/uL (ref 150–400)
RBC: 4.15 MIL/uL (ref 3.87–5.11)
RDW: 13.6 % (ref 11.5–15.5)
WBC: 5.1 K/uL (ref 4.0–10.5)

## 2011-08-29 LAB — DIFFERENTIAL
Basophils Absolute: 0 10*3/uL (ref 0.0–0.1)
Lymphocytes Relative: 27 % (ref 12–46)
Lymphs Abs: 1.4 10*3/uL (ref 0.7–4.0)
Neutro Abs: 3 10*3/uL (ref 1.7–7.7)

## 2011-08-29 LAB — BASIC METABOLIC PANEL WITH GFR
BUN: 17 mg/dL (ref 6–23)
CO2: 29 meq/L (ref 19–32)
Calcium: 9.7 mg/dL (ref 8.4–10.5)
Chloride: 101 meq/L (ref 96–112)
Creatinine, Ser: 0.7 mg/dL (ref 0.50–1.10)
GFR calc Af Amer: >90 mL/min
GFR calc non Af Amer: >90 mL/min
Glucose, Bld: 81 mg/dL (ref 70–99)
Potassium: 4.1 meq/L (ref 3.5–5.1)
Sodium: 140 meq/L (ref 135–145)

## 2011-08-29 MED ORDER — IOHEXOL 300 MG/ML  SOLN
80.0000 mL | Freq: Once | INTRAMUSCULAR | Status: AC | PRN
  Administered 2011-08-29: 80 mL via INTRAVENOUS

## 2011-08-29 MED ORDER — HYDROCODONE-ACETAMINOPHEN 5-325 MG PO TABS: 1.0000 | ORAL_TABLET | Freq: Four times a day (QID) | ORAL | Status: AC | PRN
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
## 2011-08-29 NOTE — Patient Instructions (Signed)
Instructed pr on the proper use of an IS. Pt performed x 10 . Able to accomplish . Pt tolerated well

## 2011-09-04 ENCOUNTER — Ambulatory Visit (INDEPENDENT_AMBULATORY_CARE_PROVIDER_SITE_OTHER): Payer: Managed Care, Other (non HMO) | Admitting: Family Medicine

## 2011-09-04 ENCOUNTER — Encounter: Payer: Self-pay | Admitting: Family Medicine

## 2011-09-04 DIAGNOSIS — M940 Chondrocostal junction syndrome [Tietze]: Secondary | ICD-10-CM | POA: Insufficient documentation

## 2011-09-04 DIAGNOSIS — R52 Pain, unspecified: Secondary | ICD-10-CM

## 2011-09-04 MED ORDER — KETOROLAC TROMETHAMINE 60 MG/2ML IJ SOLN
60.0000 mg | Freq: Once | INTRAMUSCULAR | Status: AC
  Administered 2011-09-04: 60 mg via INTRAMUSCULAR

## 2011-09-04 MED ORDER — TRAMADOL HCL 50 MG PO TABS: 50.0000 mg | ORAL_TABLET | Freq: Three times a day (TID) | ORAL | Status: AC | PRN

## 2011-09-04 MED ORDER — NAPROXEN 500 MG PO TABS: 500.0000 mg | ORAL_TABLET | Freq: Two times a day (BID) | ORAL | Status: DC

## 2011-09-04 NOTE — Assessment & Plan Note (Signed)
New.  Reviewed pt's ER reports and imaging x2.  No bony abnormality.  Mechanism of injury and PE consistent w/ sternocostal inflammation and possible misalignment.  Start NSAIDs, Ultram.  Pt may need PT modalities- refer to ortho for complete evaluation and tx.  Pt expressed understanding and is in agreement w/ plan.

## 2011-09-04 NOTE — Assessment & Plan Note (Signed)
New.  Pt's mechanism of injury and PE consistent w/ cartilage inflammation and possible misalignment.  Start NSAIDs.  Refer to ortho for evaluation and tx- possible PT.  Reviewed supportive care and red flags that should prompt return.  Pt expressed understanding and is in agreement w/ plan.
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
## 2011-09-04 NOTE — Patient Instructions (Signed)
We'll call you with your ortho appt Start the Naproxen twice daily- w/ food Take the Ultram for pain as needed- it should make you less tired than the narcotics ICE or HEAT- whichever feels better Call with any questions or concerns Hang in there!!!

## 2012-02-18 ENCOUNTER — Ambulatory Visit: Payer: Managed Care, Other (non HMO) | Admitting: Family Medicine

## 2012-02-21 ENCOUNTER — Other Ambulatory Visit: Payer: Self-pay

## 2012-02-21 NOTE — Telephone Encounter (Signed)
Pt states has a bad blistering on both feet for couple weeks. 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 Pt also states thinks it may be athletes foot but never have had it. Pt verified pharmacy. PLz advise    MW

## 2012-02-21 NOTE — Telephone Encounter (Signed)
Advised pt : Neena Rhymes, MD 02/21/2012 12:08 PM Signed  Should use Lotramin Ultra OTC- and if this doesn't work, needs OV prior to sending med. Can't send med w/out seeing feet. If unable to come for OV, should go to [facility]  Pt stated understanding.    MW

## 2012-02-21 NOTE — Telephone Encounter (Signed)
Should use Lotramin Ultra OTC- and if this doesn't work, needs OV prior to sending med.  Can't send med w/out seeing feet.  If unable to come for OV, should go to [facility]

## 2012-02-22 ENCOUNTER — Ambulatory Visit (INDEPENDENT_AMBULATORY_CARE_PROVIDER_SITE_OTHER): Payer: Managed Care, Other (non HMO) | Admitting: Family Medicine

## 2012-02-22 ENCOUNTER — Encounter: Payer: Self-pay | Admitting: Family Medicine

## 2012-02-22 VITALS — BP 118/74 | HR 79 | Temp 98.1°F | Ht 62.0 in | Wt 140.2 lb

## 2012-02-22 DIAGNOSIS — M5416 Radiculopathy, lumbar region: Secondary | ICD-10-CM | POA: Insufficient documentation

## 2012-02-22 DIAGNOSIS — E785 Hyperlipidemia, unspecified: Secondary | ICD-10-CM

## 2012-02-22 DIAGNOSIS — IMO0002 Reserved for concepts with insufficient information to code with codable children: Secondary | ICD-10-CM

## 2012-02-22 DIAGNOSIS — L259 Unspecified contact dermatitis, unspecified cause: Secondary | ICD-10-CM

## 2012-02-22 DIAGNOSIS — Z0000 Encounter for general adult medical examination without abnormal findings: Secondary | ICD-10-CM

## 2012-02-22 DIAGNOSIS — G5620 Lesion of ulnar nerve, unspecified upper limb: Secondary | ICD-10-CM

## 2012-02-22 LAB — CBC WITH DIFFERENTIAL/PLATELET
Basophils Relative: 0.7 % (ref 0.0–3.0)
Eosinophils Relative: 3.5 % (ref 0.0–5.0)
HCT: 39.4 % (ref 36.0–46.0)
Hemoglobin: 13 g/dL (ref 12.0–15.0)
Lymphs Abs: 2.3 10*3/uL (ref 0.7–4.0)
MCV: 94.4 fl (ref 78.0–100.0)
Monocytes Absolute: 0.4 10*3/uL (ref 0.1–1.0)
Monocytes Relative: 7.1 % (ref 3.0–12.0)
Neutro Abs: 2.2 10*3/uL (ref 1.4–7.7)
WBC: 5.1 10*3/uL (ref 4.5–10.5)

## 2012-02-22 LAB — BASIC METABOLIC PANEL
BUN: 15 mg/dL (ref 6–23)
CO2: 25 mEq/L (ref 19–32)
Calcium: 9 mg/dL (ref 8.4–10.5)
Creatinine, Ser: 0.7 mg/dL (ref 0.4–1.2)
Glucose, Bld: 93 mg/dL (ref 70–99)

## 2012-02-22 LAB — TSH: TSH: 1.58 u[IU]/mL (ref 0.35–5.50)

## 2012-02-22 LAB — HEPATIC FUNCTION PANEL
Albumin: 4 g/dL (ref 3.5–5.2)
Alkaline Phosphatase: 29 U/L — ABNORMAL LOW (ref 39–117)
Total Protein: 7 g/dL (ref 6.0–8.3)

## 2012-02-22 LAB — LDL CHOLESTEROL, DIRECT: Direct LDL: 177.5 mg/dL

## 2012-02-22 LAB — LIPID PANEL
Cholesterol: 324 mg/dL — ABNORMAL HIGH (ref 0–200)
Triglycerides: 659 mg/dL — ABNORMAL HIGH (ref 0.0–149.0)

## 2012-02-22 MED ORDER — TRIAMCINOLONE ACETONIDE 0.1 % EX OINT: TOPICAL_OINTMENT | Freq: Two times a day (BID) | CUTANEOUS | Status: DC

## 2012-02-22 MED ORDER — CYCLOBENZAPRINE HCL 10 MG PO TABS: 10.0000 mg | ORAL_TABLET | Freq: Three times a day (TID) | ORAL | Status: DC | PRN

## 2012-02-22 MED ORDER — PREDNISONE 10 MG PO TABS: ORAL_TABLET | ORAL | Status: DC

## 2012-02-22 MED ORDER — METHYLPREDNISOLONE ACETATE 80 MG/ML IJ SUSP
80.0000 mg | Freq: Once | INTRAMUSCULAR | Status: AC
  Administered 2012-02-22: 80 mg via INTRAMUSCULAR

## 2012-02-22 MED ORDER — ATORVASTATIN CALCIUM 40 MG PO TABS: 40.0000 mg | ORAL_TABLET | Freq: Every day | ORAL | Status: DC

## 2012-02-22 NOTE — Progress Notes (Signed)
  Subjective:    Patient ID: Kim Edwards, female    DOB: 10/16/1955, 56 y.o.   MRN: [id number]  HPI Foot rash- sxs started 6 weeks ago.  Initially thought it was poison ivy- tx'd w/ calamine and other OTC txs.  No relief after 3 weeks.  Started OTC antifungal w/ some improvement but sxs returned.  Some vesicles.  Very itchy, burns.  No at home w/ similar sxs.  Hyperlipidemia- chronic problem, on Lipitor.  Due for labs.  Denies abd pain, N/V, myalgias.  Lumbar back pain- sxs started 1 month ago, initially was having numbness down R leg.  Now will have intermittent numbness down both leg.  No weakness.  No bowel or bladder incontinence.  R ulnar neuropathy- will have severe pain w/ placing elbow on hard surface   Review of Systems For ROS see HPI     Objective:   Physical Exam  Vitals reviewed. Constitutional: She is oriented to person, place, and time. She appears well-developed and well-nourished. No distress.  HENT:  Head: Normocephalic and atraumatic.  Eyes: Conjunctivae normal and EOM are normal. Pupils are equal, round, and reactive to light.  Neck: Normal range of motion. Neck supple. No thyromegaly present.  Cardiovascular: Normal rate, regular rhythm, normal heart sounds and intact distal pulses.   No murmur heard. Pulmonary/Chest: Effort normal and breath sounds normal. No respiratory distress.  Abdominal: Soft. She exhibits no distension. There is no tenderness.  Musculoskeletal: She exhibits no edema.       (-) SLR bilaterally + lumbar paraspinal spasm Pain w/ flexion/extension + TTP over R ulnar nerve  Lymphadenopathy:    She has no cervical adenopathy.  Neurological: She is alert and oriented to person, place, and time. She has normal reflexes.  Skin: Skin is warm and dry. Rash (scaling erythematous rash on feet bilaterally in distribution pattern of shoes- consisten w/ severe contact dermatitis) noted. There is erythema.  Psychiatric: She has a normal mood and  affect. Her behavior is normal.          Assessment & Plan:

## 2012-02-22 NOTE — Patient Instructions (Addendum)
Schedule your complete physical at your convenience Start the Prednisone tomorrow as directed- take w/ food Use the Flexeril at night for sleep (muscle relaxer) Heating pad or Thermacare patch as needed for pain relief We'll notify you of your lab results Someone will call you with your ortho appt Use the Triamcinolone ointment on your rash twice daily Call with any questions or concerns Happy Fall!!!

## 2012-02-26 ENCOUNTER — Telehealth: Payer: Self-pay

## 2012-02-26 MED ORDER — FENOFIBRATE 160 MG PO TABS: 160.0000 mg | ORAL_TABLET | Freq: Every day | ORAL | Status: DC

## 2012-02-26 NOTE — Telephone Encounter (Signed)
Pt starting meds based off labs per [place]. Already advised pt her results. Pt verified pharmacy and Rx faxed.   MW

## 2012-02-29 LAB — VITAMIN D 1,25 DIHYDROXY: Vitamin D 1, 25 (OH)2 Total: 52 pg/mL (ref 18–72)

## 2012-03-02 NOTE — Assessment & Plan Note (Signed)
Chronic problem.  Overdue on labs.  Tolerating statin w/out difficulty.  Check labs.  Adjust meds prn

## 2012-03-02 NOTE — Assessment & Plan Note (Signed)
New.  Refer to ortho for evaluation and tx.  Pt expressed understanding and is in agreement w/ plan.

## 2012-03-02 NOTE — Assessment & Plan Note (Signed)
New.  Pt's rash is in exact distribution of contact w/ shoes.  Start topical steroid cream in addition to oral steroids.  Reviewed supportive care and red flags that should prompt return.  Pt expressed understanding and is in agreement w/ plan.

## 2012-03-02 NOTE — Assessment & Plan Note (Signed)
New.  Start steroids for both dermatitis and radicular pain.  Muscle relaxer prn.  Reviewed supportive care and red flags that should prompt return.  Pt expressed understanding and is in agreement w/ plan.

## 2012-05-19 ENCOUNTER — Telehealth: Payer: Self-pay | Admitting: Family Medicine

## 2012-05-19 MED ORDER — ATORVASTATIN CALCIUM 40 MG PO TABS: 40.0000 mg | ORAL_TABLET | Freq: Every day | ORAL | Status: DC

## 2012-05-19 NOTE — Telephone Encounter (Signed)
Refill: Lipitor 40 mg tablet. Take 1 tablet by mouth every day. Qty 30. Last fill 04-19-12

## 2012-05-19 NOTE — Telephone Encounter (Signed)
Rx sent to pharmacy by e-script.//AB/CMA 

## 2012-09-09 ENCOUNTER — Telehealth: Payer: Self-pay | Admitting: General Practice

## 2012-09-09 NOTE — Telephone Encounter (Signed)
PA started and faxed on 09-09-12.

## 2012-09-09 NOTE — Telephone Encounter (Signed)
Pt wants name brand Lipitor due to her having a coupon. Requested PA for from [facility].

## 2012-09-16 NOTE — Telephone Encounter (Signed)
PA for Lipitor denied due to Pt not having an intolerance to generic of Lipitor.

## 2013-02-04 ENCOUNTER — Ambulatory Visit (INDEPENDENT_AMBULATORY_CARE_PROVIDER_SITE_OTHER): Payer: 59 | Admitting: Family Medicine

## 2013-02-04 ENCOUNTER — Encounter: Payer: Self-pay | Admitting: Family Medicine

## 2013-02-04 VITALS — BP 116/70 | HR 81 | Temp 98.1°F | Resp 16 | Wt 143.0 lb

## 2013-02-04 DIAGNOSIS — L720 Epidermal cyst: Secondary | ICD-10-CM | POA: Insufficient documentation

## 2013-02-04 DIAGNOSIS — M5416 Radiculopathy, lumbar region: Secondary | ICD-10-CM

## 2013-02-04 DIAGNOSIS — L723 Sebaceous cyst: Secondary | ICD-10-CM

## 2013-02-04 DIAGNOSIS — IMO0002 Reserved for concepts with insufficient information to code with codable children: Secondary | ICD-10-CM

## 2013-02-04 MED ORDER — CYCLOBENZAPRINE HCL 10 MG PO TABS: 10.0000 mg | ORAL_TABLET | Freq: Three times a day (TID) | ORAL | Status: DC | PRN

## 2013-02-04 MED ORDER — NAPROXEN 500 MG PO TABS: 500.0000 mg | ORAL_TABLET | Freq: Two times a day (BID) | ORAL | Status: AC

## 2013-02-04 NOTE — Patient Instructions (Signed)
Schedule your complete physical Start the Naproxen twice daily for inflammation- take w/ food Use the flexeril (muscle relaxer) at night HEAT! Apply Neosporin to the cyst we just removed Call with any questions or concerns- particularly if the back pain doesn't improve Hang in there!!

## 2013-02-04 NOTE — Assessment & Plan Note (Addendum)
Deteriorated but asymptomatic today w/ exception of TTP over R SI joint.  Start naproxen as scheduled, flexeril prn.  If no improvement, will refer to sports med/ortho.  Pt expressed understanding and is in agreement w/ plan.

## 2013-02-04 NOTE — Assessment & Plan Note (Signed)
New.  Area easily expressed in office.  No evidence of infxn.  Reviewed supportive care and red flags that should prompt return.  Pt expressed understanding and is in agreement w/ plan.

## 2013-02-04 NOTE — Progress Notes (Signed)
  Subjective:    Patient ID: Kim Edwards, female    DOB: 03/24/1956, 57 y.o.   MRN: [id number]  HPI Sciatica- pt has had intermittent pain x3-4 months.  Starting in R buttock, radiating down thigh and all the way to foot.  Pain improves w/ ibuprofen.  Some weakness of R leg.  + numb and tingling during painful episodes.  R skin lesion on neck- appeared ~2 months ago.  Not painful.  'showed up overnight'.  'i want to pop it'.  No drainage.   Review of Systems For ROS see HPI     Objective:   Physical Exam  Vitals reviewed. Constitutional: She is oriented to person, place, and time. She appears well-developed and well-nourished. No distress.  Musculoskeletal: She exhibits tenderness (R SI joint TTP). She exhibits no edema.  Neurological: She is alert and oriented to person, place, and time. She has normal reflexes.  (-) SLR bilaterally  Skin: Skin is warm and dry.  1 cm inclusion cyst on R neck- prepped w/ ETOH, punctured w/ 23 gauge needle and core easily expressed          Assessment & Plan:

## 2013-03-31 ENCOUNTER — Ambulatory Visit (INDEPENDENT_AMBULATORY_CARE_PROVIDER_SITE_OTHER): Payer: 59 | Admitting: Family Medicine

## 2013-03-31 ENCOUNTER — Encounter: Payer: Self-pay | Admitting: Family Medicine

## 2013-03-31 VITALS — BP 100/70 | HR 80 | Temp 98.4°F | Wt 146.2 lb

## 2013-03-31 DIAGNOSIS — M5416 Radiculopathy, lumbar region: Secondary | ICD-10-CM

## 2013-03-31 DIAGNOSIS — IMO0002 Reserved for concepts with insufficient information to code with codable children: Secondary | ICD-10-CM

## 2013-03-31 DIAGNOSIS — E785 Hyperlipidemia, unspecified: Secondary | ICD-10-CM

## 2013-03-31 MED ORDER — ROSUVASTATIN CALCIUM 20 MG PO TABS: 20.0000 mg | ORAL_TABLET | Freq: Every day | ORAL | Status: DC

## 2013-03-31 MED ORDER — PREDNISONE 10 MG PO TABS: ORAL_TABLET | ORAL | Status: DC

## 2013-03-31 NOTE — Assessment & Plan Note (Signed)
Chronic problem.  Having myalgias w/ lipitor.  Stopped med.  Will switch to Crestor and monitor for sxs improvement.  Pt expressed understanding and is in agreement w/ plan.

## 2013-03-31 NOTE — Patient Instructions (Signed)
Follow up in 3 months to recheck cholesterol Start the Crestor daily Start the Prednisone as directed- take w/ food HEAT! We'll call you with your ortho appt Call with any questions or concerns Hang in there! Happy Holidays!!

## 2013-03-31 NOTE — Assessment & Plan Note (Signed)
Deteriorated.  sxs are worsening- no pain on exam today.  Start pred taper.  Refer to ortho for evaluation and tx.

## 2013-03-31 NOTE — Progress Notes (Signed)
Pre visit review using our clinic review tool, if applicable. No additional management support is needed unless otherwise documented below in the visit note. 

## 2013-03-31 NOTE — Progress Notes (Signed)
   Subjective:    Patient ID: Kim Edwards, female    DOB: 02-Feb-1956, 57 y.o.   MRN: [id number]  HPI Lumbar radiculopathy- seen mid October for R sided lumbar back pain radiating into buttock and 'all the way down to ankle now'.  Started on Naproxen and flexeril but pt reports no relief.  sxs are intermittent.  Has increased tingling while driving.  Statin intolerance- neck pain, muscles aches w/ lipitor.  Stopped meds.   Review of Systems For ROS see HPI     Objective:   Physical Exam  Vitals reviewed. Constitutional: She is oriented to person, place, and time. She appears well-developed and well-nourished. No distress.  Musculoskeletal: She exhibits no edema and no tenderness.  Neurological: She is alert and oriented to person, place, and time. She has normal reflexes.  (-) SLR bilaterally Normal gait  Skin: Skin is warm and dry.  Psychiatric: She has a normal mood and affect. Her behavior is normal. Thought content normal.          Assessment & Plan:

## 2013-08-21 ENCOUNTER — Telehealth: Payer: Self-pay

## 2013-08-21 NOTE — Telephone Encounter (Signed)
Left message for call back Non-identifiable   Pap- 03/31/10 CCS- 03/31/05

## 2013-08-24 ENCOUNTER — Encounter: Payer: Self-pay | Admitting: Family Medicine

## 2013-08-24 ENCOUNTER — Ambulatory Visit (INDEPENDENT_AMBULATORY_CARE_PROVIDER_SITE_OTHER): Payer: PRIVATE HEALTH INSURANCE | Admitting: Family Medicine

## 2013-08-24 ENCOUNTER — Other Ambulatory Visit (HOSPITAL_COMMUNITY)
Admission: RE | Admit: 2013-08-24 | Discharge: 2013-08-24 | Disposition: A | Discharge status: Home / Self Care | Payer: No Typology Code available for payment source | Source: Ambulatory Visit | Attending: Family Medicine | Admitting: Family Medicine

## 2013-08-24 VITALS — BP 112/80 | HR 65 | Temp 98.4°F | Resp 16 | Ht 62.25 in | Wt 146.1 lb

## 2013-08-24 DIAGNOSIS — Z1151 Encounter for screening for human papillomavirus (HPV): Secondary | ICD-10-CM | POA: Insufficient documentation

## 2013-08-24 DIAGNOSIS — Z0000 Encounter for general adult medical examination without abnormal findings: Secondary | ICD-10-CM | POA: Insufficient documentation

## 2013-08-24 DIAGNOSIS — Z124 Encounter for screening for malignant neoplasm of cervix: Secondary | ICD-10-CM

## 2013-08-24 DIAGNOSIS — Z01419 Encounter for gynecological examination (general) (routine) without abnormal findings: Secondary | ICD-10-CM

## 2013-08-24 DIAGNOSIS — Z1231 Encounter for screening mammogram for malignant neoplasm of breast: Secondary | ICD-10-CM

## 2013-08-24 DIAGNOSIS — M7710 Lateral epicondylitis, unspecified elbow: Secondary | ICD-10-CM

## 2013-08-24 DIAGNOSIS — L259 Unspecified contact dermatitis, unspecified cause: Secondary | ICD-10-CM

## 2013-08-24 DIAGNOSIS — M7711 Lateral epicondylitis, right elbow: Secondary | ICD-10-CM

## 2013-08-24 LAB — CBC WITH DIFFERENTIAL/PLATELET
BASOS PCT: 0.4 % (ref 0.0–3.0)
Basophils Absolute: 0 10*3/uL (ref 0.0–0.1)
EOS ABS: 0.3 10*3/uL (ref 0.0–0.7)
EOS PCT: 3.5 % (ref 0.0–5.0)
HCT: 40.2 % (ref 36.0–46.0)
HEMOGLOBIN: 13.5 g/dL (ref 12.0–15.0)
LYMPHS PCT: 28.9 % (ref 12.0–46.0)
Lymphs Abs: 2.6 10*3/uL (ref 0.7–4.0)
MCHC: 33.6 g/dL (ref 30.0–36.0)
MCV: 91.5 fl (ref 78.0–100.0)
MONOS PCT: 6.3 % (ref 3.0–12.0)
Monocytes Absolute: 0.6 10*3/uL (ref 0.1–1.0)
NEUTROS ABS: 5.5 10*3/uL (ref 1.4–7.7)
NEUTROS PCT: 60.9 % (ref 43.0–77.0)
Platelets: 299 10*3/uL (ref 150.0–400.0)
RBC: 4.39 Mil/uL (ref 3.87–5.11)
RDW: 13.5 % (ref 11.5–14.6)
WBC: 9 10*3/uL (ref 4.5–10.5)

## 2013-08-24 LAB — BASIC METABOLIC PANEL
BUN: 15 mg/dL (ref 6–23)
CALCIUM: 9.5 mg/dL (ref 8.4–10.5)
CO2: 28 meq/L (ref 19–32)
CREATININE: 0.8 mg/dL (ref 0.4–1.2)
Chloride: 99 mEq/L (ref 96–112)
GFR: 83.76 mL/min (ref >60.00)
GLUCOSE: 94 mg/dL (ref 70–99)
Potassium: 3.9 mEq/L (ref 3.5–5.1)
SODIUM: 138 meq/L (ref 135–145)

## 2013-08-24 LAB — HEPATIC FUNCTION PANEL
ALBUMIN: 4.4 g/dL (ref 3.5–5.2)
ALT: 27 U/L (ref 0–35)
AST: 22 U/L (ref 0–37)
Alkaline Phosphatase: 34 U/L — ABNORMAL LOW (ref 39–117)
BILIRUBIN DIRECT: 0 mg/dL (ref 0.0–0.3)
TOTAL PROTEIN: 7.3 g/dL (ref 6.0–8.3)
Total Bilirubin: 0.7 mg/dL (ref 0.2–1.2)

## 2013-08-24 LAB — TSH: TSH: 1.75 u[IU]/mL (ref 0.35–5.50)

## 2013-08-24 LAB — LIPID PANEL
CHOL/HDL RATIO: 6
Cholesterol: 399 mg/dL — ABNORMAL HIGH (ref 0–200)
HDL: 68 mg/dL (ref >39.00)
LDL Cholesterol: 267 mg/dL — ABNORMAL HIGH (ref 0–99)
TRIGLYCERIDES: 322 mg/dL — AB (ref 0.0–149.0)
VLDL: 64.4 mg/dL — AB (ref 0.0–40.0)

## 2013-08-24 MED ORDER — TRIAMCINOLONE ACETONIDE 0.1 % EX OINT: TOPICAL_OINTMENT | Freq: Two times a day (BID) | CUTANEOUS | Status: DC

## 2013-08-24 NOTE — Progress Notes (Signed)
   Subjective:    Patient ID: Kim Edwards, female    DOB: [birth year], 58 y.o.   MRN: [id number]  HPI CPE- UTD on colonoscopy.  Due for mammo and pap.  Not currently on cholesterol meds due to severe muscle aches.  R lateral epicondylitis- severe.  Banged arm months ago and since then has had difficulty opening bags or pulling against resistance for certain motions/activities.  Rash- develops on exposed skin when in contact w/ flour.  Will develop welts and then they ulcerate and scab.  Very itchy.   Review of Systems Patient reports no vision/ hearing changes, adenopathy,fever, weight change,  persistant/recurrent hoarseness , swallowing issues, chest pain, palpitations, edema, persistant/recurrent cough, hemoptysis, dyspnea (rest/exertional/paroxysmal nocturnal), gastrointestinal bleeding (melena, rectal bleeding), abdominal pain, significant heartburn, bowel changes, GU symptoms (dysuria, hematuria, incontinence), Gyn symptoms (abnormal  bleeding, pain),  syncope, focal weakness, memory loss, numbness & tingling, skin/hair/nail changes, abnormal bruising or bleeding, anxiety, or depression.     Objective:   Physical Exam  General Appearance:    Alert, cooperative, no distress, appears stated age  Head:    Normocephalic, without obvious abnormality, atraumatic  Eyes:    PERRL, conjunctiva/corneas clear, EOM's intact, fundi    benign, both eyes  Ears:    Normal TM's and external ear canals, both ears  Nose:   Nares normal, septum midline, mucosa normal, no drainage    or sinus tenderness  Throat:   Lips, mucosa, and tongue normal; teeth and gums normal  Neck:   Supple, symmetrical, trachea midline, no adenopathy;    Thyroid: no enlargement/tenderness/nodules  Back:     Symmetric, no curvature, ROM normal, no CVA tenderness  Lungs:     Clear to auscultation bilaterally, respirations unlabored  Chest Wall:    No tenderness or deformity   Heart:    Regular rate and rhythm, S1 and S2  normal, no murmur, rub   or gallop  Breast Exam:    No tenderness, masses, or nipple abnormality  Abdomen:     Soft, non-tender, bowel sounds active all four quadrants,    no masses, no organomegaly  Genitalia:    External genitalia normal, cervix normal in appearance, no CMT, uterus in normal size and position, adnexa w/out mass or tenderness, mucosa pink and moist, no lesions or discharge present  Rectal:    Normal external appearance  Extremities:   Extremities normal, atraumatic, no cyanosis or edema  Pulses:   2+ and symmetric all extremities  Skin:   Skin color, texture, turgor normal, no rashes or lesions  Lymph nodes:   Cervical, supraclavicular, and axillary nodes normal  Neurologic:   CNII-XII intact, normal strength, sensation and reflexes    throughout          Assessment & Plan:

## 2013-08-24 NOTE — Patient Instructions (Signed)
Follow up in 6 months to recheck cholesterol We'll notify you of your lab results and make any changes if needed We'll call you with your ortho and mammo appts ICE your elbow! Use the Triamcinolone ointment twice daily as needed for the rash Call with any questions or concerns HAPPY MOTHER'S DAY!

## 2013-08-24 NOTE — Progress Notes (Signed)
Pre visit review using our clinic review tool, if applicable. No additional management support is needed unless otherwise documented below in the visit note. 

## 2013-08-26 ENCOUNTER — Other Ambulatory Visit: Payer: Self-pay | Admitting: General Practice

## 2013-08-26 ENCOUNTER — Encounter: Payer: Self-pay | Admitting: General Practice

## 2013-08-26 MED ORDER — PITAVASTATIN CALCIUM 4 MG PO TABS: 1.0000 | ORAL_TABLET | Freq: Every day | ORAL | Status: DC

## 2013-08-28 ENCOUNTER — Encounter: Payer: Self-pay | Admitting: General Practice

## 2013-08-30 LAB — VITAMIN D 1,25 DIHYDROXY
VITAMIN D3 1, 25 (OH): 37 pg/mL
Vitamin D 1, 25 (OH)2 Total: 37 pg/mL (ref 18–72)

## 2013-08-30 NOTE — Assessment & Plan Note (Signed)
Pt's PE WNL.  UTD on colonoscopy.  Due for mammo.  Pap collected.  Check labs.  Anticipatory guidance provided.

## 2013-08-30 NOTE — Assessment & Plan Note (Signed)
Pap collected. 

## 2013-08-30 NOTE — Assessment & Plan Note (Signed)
New.  Start scheduled NSAIDs, refer to ortho.  Pt expressed understanding and is in agreement w/ plan.

## 2013-08-30 NOTE — Assessment & Plan Note (Signed)
New.  Occurs w/ exposure to flour.  Encouraged daily antihistamine use.  Triamcinolone prn.  Reviewed supportive care and red flags that should prompt return.  Pt expressed understanding and is in agreement w/ plan.

## 2013-08-31 ENCOUNTER — Encounter: Payer: Self-pay | Admitting: General Practice

## 2013-08-31 NOTE — Telephone Encounter (Signed)
Unable to reach pre visit.  

## 2013-10-26 ENCOUNTER — Other Ambulatory Visit: Payer: Self-pay | Admitting: Family Medicine

## 2013-10-26 NOTE — Telephone Encounter (Signed)
REFILL REQUEST:  triamcinolone ointment (KENALOG) 0.1 %  Apply topically 2 (two) times daily. - Topical  Last filled:  08/24/13 Amt filled:  90 g, 1 refill Last OV: 08/24/13

## 2013-10-28 ENCOUNTER — Ambulatory Visit
Admission: RE | Admit: 2013-10-28 | Discharge: 2013-10-28 | Disposition: A | Discharge status: Home / Self Care | Payer: PRIVATE HEALTH INSURANCE | Source: Ambulatory Visit | Attending: Family Medicine | Admitting: Family Medicine

## 2013-10-28 ENCOUNTER — Encounter (INDEPENDENT_AMBULATORY_CARE_PROVIDER_SITE_OTHER): Payer: Self-pay

## 2013-10-28 DIAGNOSIS — Z1231 Encounter for screening mammogram for malignant neoplasm of breast: Secondary | ICD-10-CM

## 2014-04-20 ENCOUNTER — Other Ambulatory Visit: Payer: Self-pay | Admitting: *Deleted

## 2014-04-20 MED ORDER — PITAVASTATIN CALCIUM 4 MG PO TABS: 1.0000 | ORAL_TABLET | Freq: Every day | ORAL | Status: DC

## 2014-04-20 NOTE — Telephone Encounter (Signed)
Rx request to pharmacy/[facility] Requested drug refills are authorized, however, the patient needs further evaluation and/or laboratory testing before further refills are given. Ask her to make an appointment for this.  

## 2014-05-28 ENCOUNTER — Other Ambulatory Visit: Payer: Self-pay | Admitting: General Practice

## 2014-05-28 MED ORDER — PITAVASTATIN CALCIUM 4 MG PO TABS: 1.0000 | ORAL_TABLET | Freq: Every day | ORAL | Status: DC

## 2014-10-25 ENCOUNTER — Other Ambulatory Visit: Payer: Self-pay | Admitting: Family Medicine

## 2014-10-26 NOTE — Telephone Encounter (Signed)
No further refills without a cholesterol follow up.

## 2014-11-24 ENCOUNTER — Telehealth: Payer: Self-pay | Admitting: Family Medicine

## 2014-11-24 ENCOUNTER — Telehealth: Payer: Self-pay | Admitting: *Deleted

## 2014-11-24 NOTE — Telephone Encounter (Signed)
Prior authorization initiated for Livalo. Awaiting determination. JG//CMA

## 2014-11-24 NOTE — Telephone Encounter (Signed)
Error/gd °

## 2014-11-25 NOTE — Telephone Encounter (Signed)
Approved.  

## 2014-12-29 ENCOUNTER — Ambulatory Visit: Payer: PRIVATE HEALTH INSURANCE | Admitting: Family Medicine

## 2015-01-02 ENCOUNTER — Other Ambulatory Visit: Payer: Self-pay | Admitting: Family Medicine

## 2015-01-03 NOTE — Telephone Encounter (Signed)
Med denied, pt needs a cholesterol follow up.  

## 2015-02-11 ENCOUNTER — Encounter: Payer: Self-pay | Admitting: Family Medicine

## 2015-02-11 ENCOUNTER — Ambulatory Visit (INDEPENDENT_AMBULATORY_CARE_PROVIDER_SITE_OTHER): Payer: PRIVATE HEALTH INSURANCE | Admitting: Family Medicine

## 2015-02-11 VITALS — BP 118/82 | HR 78 | Temp 98.0°F | Resp 16 | Ht 62.0 in | Wt 145.0 lb

## 2015-02-11 DIAGNOSIS — E785 Hyperlipidemia, unspecified: Secondary | ICD-10-CM | POA: Diagnosis not present

## 2015-02-11 LAB — HEPATIC FUNCTION PANEL
ALT: 29 U/L (ref 6–29)
AST: 28 U/L (ref 10–35)
Albumin: 4 g/dL (ref 3.6–5.1)
Alkaline Phosphatase: 28 U/L — ABNORMAL LOW (ref 33–130)
BILIRUBIN DIRECT: 0.1 mg/dL (ref <=0.2)
BILIRUBIN INDIRECT: 0.4 mg/dL (ref 0.2–1.2)
Total Bilirubin: 0.5 mg/dL (ref 0.2–1.2)
Total Protein: 6.6 g/dL (ref 6.1–8.1)

## 2015-02-11 LAB — LIPID PANEL
Cholesterol: 362 mg/dL — ABNORMAL HIGH (ref 125–200)
HDL: 50 mg/dL (ref >=46)
TRIGLYCERIDES: 631 mg/dL — AB (ref <150)
Total CHOL/HDL Ratio: 7.2 Ratio — ABNORMAL HIGH (ref <=5.0)

## 2015-02-11 LAB — BASIC METABOLIC PANEL
BUN: 14 mg/dL (ref 7–25)
CALCIUM: 9.1 mg/dL (ref 8.6–10.4)
CO2: 25 mmol/L (ref 20–31)
CREATININE: 0.63 mg/dL (ref 0.50–1.05)
Chloride: 103 mmol/L (ref 98–110)
Glucose, Bld: 85 mg/dL (ref 65–99)
Potassium: 4 mmol/L (ref 3.5–5.3)
Sodium: 140 mmol/L (ref 135–146)

## 2015-02-11 MED ORDER — PITAVASTATIN CALCIUM 4 MG PO TABS: ORAL_TABLET | ORAL | Status: DC

## 2015-02-11 NOTE — Progress Notes (Signed)
Pre visit review using our clinic review tool, if applicable. No additional management support is needed unless otherwise documented below in the visit note. 

## 2015-02-11 NOTE — Progress Notes (Signed)
   Subjective:    Patient ID: Kim Edwards, female    DOB: April 23, 1956, 59 y.o.   MRN: [id number]  HPI Hyperlipidemia- chronic problem.  Pt ran out of Livalo and fenofibrate months ago.  Pt is not able to exercise regularly due to work schedule- commuting a minimum of 5 hrs/day.  Denies CP, SOB, HAs, visual changes, abd pain, N/V.  Pt reports 'i can hear my heart beat in my neck'   Review of Systems For ROS see HPI     Objective:   Physical Exam  Constitutional: She is oriented to person, place, and time. She appears well-developed and well-nourished. No distress.  HENT:  Head: Normocephalic and atraumatic.  Eyes: Conjunctivae and EOM are normal. Pupils are equal, round, and reactive to light.  Neck: Normal range of motion. Neck supple. No thyromegaly present.  Cardiovascular: Normal rate, regular rhythm, normal heart sounds and intact distal pulses.   No murmur heard. Pulmonary/Chest: Effort normal and breath sounds normal. No respiratory distress.  Abdominal: Soft. She exhibits no distension. There is no tenderness.  Musculoskeletal: She exhibits no edema.  Lymphadenopathy:    She has no cervical adenopathy.  Neurological: She is alert and oriented to person, place, and time.  Skin: Skin is warm and dry.  Psychiatric: She has a normal mood and affect. Her behavior is normal.  Vitals reviewed.         Assessment & Plan:

## 2015-02-11 NOTE — Assessment & Plan Note (Signed)
Chronic problem.  Pt has stopped all meds.  Not exercising regularly.  Pt is now 'hearing my heart beat in my neck'.  Based on this, will get carotid dopplers to assess for blockage.  Check labs.  Restart meds prn.  Pt expressed understanding and is in agreement w/ plan.

## 2015-02-11 NOTE — Patient Instructions (Signed)
Schedule your complete physical in 6 months We'll notify you of your lab results and make any changes if needed We'll call you with your ultrasound appt Keep up the good work!  You look great! Call with any questions or concerns If you want to join [place] at the new [place] office, any scheduled appointments will automatically transfer and we will see you at [address] [place] [address] [place], [place] [address]  Happy Fall!!!

## 2015-02-17 ENCOUNTER — Other Ambulatory Visit: Payer: Self-pay | Admitting: General Practice

## 2015-02-17 ENCOUNTER — Other Ambulatory Visit: Payer: Self-pay | Admitting: Family Medicine

## 2015-02-17 DIAGNOSIS — E785 Hyperlipidemia, unspecified: Secondary | ICD-10-CM

## 2015-02-17 MED ORDER — FENOFIBRATE 160 MG PO TABS: 160.0000 mg | ORAL_TABLET | Freq: Every day | ORAL | Status: DC

## 2015-02-19 ENCOUNTER — Ambulatory Visit (HOSPITAL_BASED_OUTPATIENT_CLINIC_OR_DEPARTMENT_OTHER): Payer: PRIVATE HEALTH INSURANCE

## 2015-04-24 HISTORY — PX: LASIK: SHX215

## 2015-08-12 ENCOUNTER — Encounter: Payer: PRIVATE HEALTH INSURANCE | Admitting: Family Medicine

## 2015-08-31 ENCOUNTER — Ambulatory Visit (INDEPENDENT_AMBULATORY_CARE_PROVIDER_SITE_OTHER): Payer: Managed Care, Other (non HMO) | Admitting: Family Medicine

## 2015-08-31 ENCOUNTER — Encounter: Payer: Self-pay | Admitting: Family Medicine

## 2015-08-31 VITALS — BP 120/78 | HR 76 | Temp 98.1°F | Resp 16 | Ht 62.0 in | Wt 144.5 lb

## 2015-08-31 DIAGNOSIS — Z1231 Encounter for screening mammogram for malignant neoplasm of breast: Secondary | ICD-10-CM

## 2015-08-31 DIAGNOSIS — Z23 Encounter for immunization: Secondary | ICD-10-CM | POA: Diagnosis not present

## 2015-08-31 DIAGNOSIS — Z1211 Encounter for screening for malignant neoplasm of colon: Secondary | ICD-10-CM | POA: Diagnosis not present

## 2015-08-31 DIAGNOSIS — Z0000 Encounter for general adult medical examination without abnormal findings: Secondary | ICD-10-CM | POA: Diagnosis not present

## 2015-08-31 LAB — CBC WITH DIFFERENTIAL/PLATELET
BASOS ABS: 0 10*3/uL (ref 0.0–0.1)
BASOS PCT: 0.6 % (ref 0.0–3.0)
EOS ABS: 0.2 10*3/uL (ref 0.0–0.7)
Eosinophils Relative: 3.9 % (ref 0.0–5.0)
HEMATOCRIT: 38.9 % (ref 36.0–46.0)
Hemoglobin: 13.6 g/dL (ref 12.0–15.0)
LYMPHS ABS: 2.2 10*3/uL (ref 0.7–4.0)
Lymphocytes Relative: 40.9 % (ref 12.0–46.0)
MCHC: 34.9 g/dL (ref 30.0–36.0)
MCV: 89.1 fl (ref 78.0–100.0)
Monocytes Absolute: 0.4 10*3/uL (ref 0.1–1.0)
Monocytes Relative: 7.6 % (ref 3.0–12.0)
NEUTROS ABS: 2.5 10*3/uL (ref 1.4–7.7)
NEUTROS PCT: 47 % (ref 43.0–77.0)
PLATELETS: 290 10*3/uL (ref 150.0–400.0)
RBC: 4.36 Mil/uL (ref 3.87–5.11)
RDW: 13 % (ref 11.5–15.5)
WBC: 5.4 10*3/uL (ref 4.0–10.5)

## 2015-08-31 LAB — LDL CHOLESTEROL, DIRECT: LDL DIRECT: 245 mg/dL

## 2015-08-31 LAB — HEPATIC FUNCTION PANEL
ALK PHOS: 27 U/L — AB (ref 39–117)
ALT: 22 U/L (ref 0–35)
AST: 21 U/L (ref 0–37)
Albumin: 4.5 g/dL (ref 3.5–5.2)
BILIRUBIN DIRECT: 0 mg/dL (ref 0.0–0.3)
BILIRUBIN TOTAL: 0.4 mg/dL (ref 0.2–1.2)
TOTAL PROTEIN: 7.2 g/dL (ref 6.0–8.3)

## 2015-08-31 LAB — LIPID PANEL
CHOL/HDL RATIO: 7
CHOLESTEROL: 392 mg/dL — AB (ref 0–200)
HDL: 59.4 mg/dL (ref >39.00)
Triglycerides: 732 mg/dL — ABNORMAL HIGH (ref 0.0–149.0)

## 2015-08-31 LAB — BASIC METABOLIC PANEL
BUN: 16 mg/dL (ref 6–23)
CHLORIDE: 101 meq/L (ref 96–112)
CO2: 24 meq/L (ref 19–32)
Calcium: 9.4 mg/dL (ref 8.4–10.5)
Creatinine, Ser: 0.76 mg/dL (ref 0.40–1.20)
GFR: 83.15 mL/min (ref >60.00)
GLUCOSE: 100 mg/dL — AB (ref 70–99)
POTASSIUM: 4.1 meq/L (ref 3.5–5.1)
SODIUM: 137 meq/L (ref 135–145)

## 2015-08-31 LAB — VITAMIN D 25 HYDROXY (VIT D DEFICIENCY, FRACTURES): VITD: 16.16 ng/mL — ABNORMAL LOW (ref 30.00–100.00)

## 2015-08-31 LAB — TSH: TSH: 2.55 u[IU]/mL (ref 0.35–4.50)

## 2015-08-31 MED ORDER — BUPROPION HCL ER (XL) 150 MG PO TB24: 150.0000 mg | ORAL_TABLET | Freq: Every day | ORAL | Status: DC

## 2015-08-31 MED ORDER — FENOFIBRATE 160 MG PO TABS: 160.0000 mg | ORAL_TABLET | Freq: Every day | ORAL | Status: DC

## 2015-08-31 MED ORDER — PITAVASTATIN CALCIUM 4 MG PO TABS: ORAL_TABLET | ORAL | Status: DC

## 2015-08-31 NOTE — Assessment & Plan Note (Signed)
Pt's PE WNL.  Due for repeat colonoscopy- order entered.  Due for mammo- order entered.  Pap due next year.  Tdap updated.  Check labs.  Anticipatory guidance provided.

## 2015-08-31 NOTE — Patient Instructions (Addendum)
Follow up in 6 months to recheck cholesterol We'll notify you of your lab results and make any changes if needed Continue to work on healthy diet and regular exercise- you look great! Start the Wellbutrin once daily in the AM [facility] call you with your mammogram and GI appts Call with any questions or concerns Thanks for sticking with [place]! Happy Mother's Day!!!

## 2015-08-31 NOTE — Progress Notes (Signed)
Pre visit review using our clinic review tool, if applicable. No additional management support is needed unless otherwise documented below in the visit note. 

## 2015-08-31 NOTE — Progress Notes (Signed)
   Subjective:    Patient ID: Kim Edwards, female    DOB: 11-03-1955, 60 y.o.   MRN: [id number]  HPI CPE- due for mammogram, repeat colonoscopy (last done in [place]).  Due for pap next year.    Review of Systems Patient reports no vision/ hearing changes, adenopathy,fever, weight change,  persistant/recurrent hoarseness , swallowing issues, chest pain, palpitations, edema, persistant/recurrent cough, hemoptysis, dyspnea (rest/exertional/paroxysmal nocturnal), gastrointestinal bleeding (melena, rectal bleeding), abdominal pain, significant heartburn, bowel changes, GU symptoms (dysuria, hematuria, incontinence), Gyn symptoms (abnormal  bleeding, pain),  syncope, focal weakness, memory loss, numbness & tingling, skin/hair/nail changes, abnormal bruising or bleeding, anxiety, or depression.     Objective:   Physical Exam General Appearance:    Alert, cooperative, no distress, appears stated age  Head:    Normocephalic, without obvious abnormality, atraumatic  Eyes:    PERRL, conjunctiva/corneas clear, EOM's intact, fundi    benign, both eyes  Ears:    Normal TM's and external ear canals, both ears  Nose:   Nares normal, septum midline, mucosa normal, no drainage    or sinus tenderness  Throat:   Lips, mucosa, and tongue normal; teeth and gums normal  Neck:   Supple, symmetrical, trachea midline, no adenopathy;    Thyroid: no enlargement/tenderness/nodules  Back:     Symmetric, no curvature, ROM normal, no CVA tenderness  Lungs:     Clear to auscultation bilaterally, respirations unlabored  Chest Wall:    No tenderness or deformity   Heart:    Regular rate and rhythm, S1 and S2 normal, no murmur, rub   or gallop  Breast Exam:    Deferred to mammo  Abdomen:     Soft, non-tender, bowel sounds active all four quadrants,    no masses, no organomegaly  Genitalia:    Deferred  Rectal:    Extremities:   Extremities normal, atraumatic, no cyanosis or edema  Pulses:   2+ and symmetric all  extremities  Skin:   Skin color, texture, turgor normal, no rashes or lesions  Lymph nodes:   Cervical, supraclavicular, and axillary nodes normal  Neurologic:   CNII-XII intact, normal strength, sensation and reflexes    throughout          Assessment & Plan:

## 2015-08-31 NOTE — Addendum Note (Signed)
Addended by: Davis Gourd on: 08/31/2015 02:21 PM   Modules accepted: Orders

## 2015-09-01 ENCOUNTER — Other Ambulatory Visit: Payer: Self-pay | Admitting: General Practice

## 2015-09-01 MED ORDER — VITAMIN D (ERGOCALCIFEROL) 1.25 MG (50000 UNIT) PO CAPS: 50000.0000 [IU] | ORAL_CAPSULE | ORAL | Status: DC

## 2015-09-09 ENCOUNTER — Telehealth: Payer: Self-pay | Admitting: Family Medicine

## 2015-09-09 ENCOUNTER — Encounter: Payer: Self-pay | Admitting: Gastroenterology

## 2015-09-09 ENCOUNTER — Other Ambulatory Visit: Payer: Self-pay | Admitting: Family Medicine

## 2015-09-09 DIAGNOSIS — Z1231 Encounter for screening mammogram for malignant neoplasm of breast: Secondary | ICD-10-CM

## 2015-09-09 MED ORDER — ROSUVASTATIN CALCIUM 20 MG PO TABS: 20.0000 mg | ORAL_TABLET | Freq: Every day | ORAL | Status: DC

## 2015-09-09 NOTE — Telephone Encounter (Signed)
Pt notified and is ok with the crestor med filled as requested.

## 2015-09-09 NOTE — Telephone Encounter (Signed)
Pt states that when she went to p/u Rx livalo that is was over $200.00 and asking if there is anything else that Dr Birdie Riddle could call in for her, [facility] [address].

## 2015-09-09 NOTE — Telephone Encounter (Signed)
We can try generic Crestor 20mg  nightly

## 2015-11-03 ENCOUNTER — Ambulatory Visit (AMBULATORY_SURGERY_CENTER): Payer: Self-pay

## 2015-11-03 ENCOUNTER — Telehealth: Payer: Self-pay | Admitting: Family Medicine

## 2015-11-03 VITALS — Ht 62.5 in | Wt 141.6 lb

## 2015-11-03 DIAGNOSIS — Z1211 Encounter for screening for malignant neoplasm of colon: Secondary | ICD-10-CM

## 2015-11-03 MED ORDER — ROSUVASTATIN CALCIUM 20 MG PO TABS: 20.0000 mg | ORAL_TABLET | Freq: Every day | ORAL | Status: DC

## 2015-11-03 MED ORDER — SUPREP BOWEL PREP KIT 17.5-3.13-1.6 GM/177ML PO SOLN: 1.0000 | Freq: Once | ORAL | Status: DC

## 2015-11-03 NOTE — Telephone Encounter (Signed)
Patient calling to request refill of rosuvastatin (CRESTOR) 20 MG tablet sent to [facility], [address].  Patients states she also switched phamacy's to [facility], all scripts going forward should be sent to [facility].

## 2015-11-03 NOTE — Progress Notes (Signed)
No allergies to eggs or soy No past problems with anesthesia No home oxygen No diet meds  Has email and internet; declined emmi  Printed pt's instructions before lt breakfast added for prep day.  Hand wrote in on pt's copy.  Pt voiced understanding.

## 2015-11-03 NOTE — Telephone Encounter (Signed)
Medication filled to pharmacy as requested.   

## 2015-11-14 ENCOUNTER — Telehealth: Payer: Self-pay | Admitting: *Deleted

## 2015-11-14 NOTE — Telephone Encounter (Signed)
Called and [facility] @ 1:42pm @ [phone]) asking the pt to RTC regarding renewing PA for Livalo.  Needing to know if the pt is still taking the Livalo.//AB/CMA

## 2015-11-15 ENCOUNTER — Telehealth: Payer: Self-pay | Admitting: Family Medicine

## 2015-11-15 NOTE — Telephone Encounter (Signed)
Pt needs refill on fenofibrate and has a new pharmacy, [facility] on [address]

## 2015-11-16 ENCOUNTER — Encounter: Payer: Self-pay | Admitting: Gastroenterology

## 2015-11-16 ENCOUNTER — Ambulatory Visit (AMBULATORY_SURGERY_CENTER): Payer: Managed Care, Other (non HMO) | Admitting: Gastroenterology

## 2015-11-16 VITALS — BP 101/65 | HR 67 | Temp 97.5°F | Resp 15 | Ht 62.0 in | Wt 141.0 lb

## 2015-11-16 DIAGNOSIS — D125 Benign neoplasm of sigmoid colon: Secondary | ICD-10-CM

## 2015-11-16 DIAGNOSIS — Z1211 Encounter for screening for malignant neoplasm of colon: Secondary | ICD-10-CM

## 2015-11-16 DIAGNOSIS — D123 Benign neoplasm of transverse colon: Secondary | ICD-10-CM | POA: Diagnosis not present

## 2015-11-16 MED ORDER — FENOFIBRATE 160 MG PO TABS: 160.0000 mg | ORAL_TABLET | Freq: Every day | ORAL | 1 refills | Status: DC

## 2015-11-16 NOTE — Telephone Encounter (Signed)
Medication filled to pharmacy as requested.   

## 2015-11-16 NOTE — Progress Notes (Signed)
Called to room to assist during endoscopic procedure.  Patient ID and intended procedure confirmed with present staff. Received instructions for my participation in the procedure from the performing physician.  

## 2015-11-16 NOTE — Op Note (Signed)
[place] Patient Name: Kim Edwards Procedure Date: 11/16/2015 1:46 PM MRN: [id number] Endoscopist: Ladene Artist , MD Age: 60 Referring MD:  Date of Birth: May 10, 1955 Gender: Female Account #: [id number] Procedure:                Colonoscopy Indications:              Screening for colorectal malignant neoplasm Medicines:                Monitored Anesthesia Care Procedure:                Pre-Anesthesia Assessment:                           - Prior to the procedure, a History and Physical                            was performed, and patient medications and                            allergies were reviewed. The patient's tolerance of                            previous anesthesia was also reviewed. The risks                            and benefits of the procedure and the sedation                            options and risks were discussed with the patient.                            All questions were answered, and informed consent                            was obtained. Prior Anticoagulants: The patient has                            taken no previous anticoagulant or antiplatelet                            agents. ASA Grade Assessment: II - A patient with                            mild systemic disease. After reviewing the risks                            and benefits, the patient was deemed in                            satisfactory condition to undergo the procedure.                           After obtaining informed consent, the colonoscope  was passed under direct vision. Throughout the                            procedure, the patient's blood pressure, pulse, and                            oxygen saturations were monitored continuously. The                            Model PCF-[id number]) scope was introduced                            through the anus and advanced to the the cecum,                            identified by  appendiceal orifice and ileocecal                            valve. The ileocecal valve, appendiceal orifice,                            and rectum were photographed. The quality of the                            bowel preparation was excellent. The colonoscopy                            was performed without difficulty. The patient                            tolerated the procedure well. Scope In: 2:01:59 PM Scope Out: 2:13:57 PM Scope Withdrawal Time: 0 hours 9 minutes 55 seconds  Total Procedure Duration: 0 hours 11 minutes 58 seconds  Findings:                 A 5 mm polyp was found in the sigmoid colon. The                            polyp was sessile. The polyp was removed with a                            cold snare. Resection and retrieval were complete.                           Two sessile polyps were found in the sigmoid colon                            and transverse colon. The polyps were 4 to 5 mm in                            size. These polyps were removed with a cold biopsy  forceps. Resection and retrieval were complete.                           The exam was otherwise without abnormality on                            direct and retroflexion views. Complications:            No immediate complications. Estimated blood loss:                            None. Estimated Blood Loss:     Estimated blood loss: none. Impression:               - One 5 mm polyp in the sigmoid colon, removed with                            a cold snare. Resected and retrieved.                           - Two 4 to 5 mm polyps in the sigmoid colon and in                            the transverse colon, removed with a cold biopsy                            forceps. Resected and retrieved.                           - The examination was otherwise normal on direct                            and retroflexion views. Recommendation:           - Repeat colonoscopy in 5 years for  surveillance if                            polyp(s) are precancerous, otherwise 10 years for                            screening.                           - Patient has a contact number available for                            emergencies. The signs and symptoms of potential                            delayed complications were discussed with the                            patient. Return to normal activities tomorrow.                            Written  discharge instructions were provided to the                            patient.                           - Resume previous diet.                           - Continue present medications.                           - Await pathology results. Ladene Artist, MD 11/16/2015 2:21:03 PM This report has been signed electronically.

## 2015-11-16 NOTE — Patient Instructions (Signed)

## 2015-11-16 NOTE — Progress Notes (Signed)
No problems noted in the recovery room. maw 

## 2015-11-16 NOTE — Progress Notes (Signed)
To recovery, report to Willis, RN, VSS 

## 2015-11-16 NOTE — Telephone Encounter (Signed)
Called and [facility] @ 11:08am @ [phone]) asking the pt to RTC regarding PA.//AB/CMA

## 2015-11-16 NOTE — Telephone Encounter (Signed)
Spoke with the pt and informed her that we received PA renewal to ensure continued therapy for the Livalo.  Asked the pt if she was still on the Livalo.  Pt stated that she is no longer taking the Livalo.  PA no longer needed.//AB/CMA

## 2015-11-17 ENCOUNTER — Telehealth: Payer: Self-pay | Admitting: *Deleted

## 2015-11-17 NOTE — Telephone Encounter (Signed)
No answer, message left for the patient. 

## 2015-11-23 ENCOUNTER — Encounter: Payer: Self-pay | Admitting: Gastroenterology

## 2015-11-24 ENCOUNTER — Ambulatory Visit
Admission: RE | Admit: 2015-11-24 | Discharge: 2015-11-24 | Disposition: A | Discharge status: Home / Self Care | Payer: Managed Care, Other (non HMO) | Source: Ambulatory Visit | Attending: Family Medicine | Admitting: Family Medicine

## 2015-11-24 DIAGNOSIS — Z1231 Encounter for screening mammogram for malignant neoplasm of breast: Secondary | ICD-10-CM

## 2015-11-29 ENCOUNTER — Telehealth: Payer: Self-pay | Admitting: Family Medicine

## 2015-11-29 NOTE — Telephone Encounter (Signed)
This medication cannot be filled, pt needs to use otc vitamin D 2000iu daily

## 2015-11-29 NOTE — Telephone Encounter (Signed)
Pt needs refill on Vit D, Kim Edwards on [employer]

## 2015-11-30 NOTE — Telephone Encounter (Signed)
Called pt and LMOVM to inform.  

## 2016-01-12 ENCOUNTER — Ambulatory Visit: Payer: Managed Care, Other (non HMO) | Admitting: Family Medicine

## 2016-01-13 ENCOUNTER — Telehealth: Payer: Self-pay | Admitting: Family Medicine

## 2016-01-13 NOTE — Telephone Encounter (Signed)
Noted  

## 2016-01-13 NOTE — Telephone Encounter (Signed)
Pt called at 10:26pm on Wed 9/20 leaving a VM on main line phone canceling appt due to being called into work. Pt states that she will call back to reschedule.

## 2016-02-16 ENCOUNTER — Encounter: Payer: Self-pay | Admitting: Family Medicine

## 2016-02-16 ENCOUNTER — Ambulatory Visit (INDEPENDENT_AMBULATORY_CARE_PROVIDER_SITE_OTHER): Payer: Managed Care, Other (non HMO) | Admitting: Family Medicine

## 2016-02-16 VITALS — BP 110/68 | HR 75 | Temp 98.9°F | Resp 16 | Ht 62.0 in | Wt 147.0 lb

## 2016-02-16 DIAGNOSIS — M25562 Pain in left knee: Secondary | ICD-10-CM | POA: Diagnosis not present

## 2016-02-16 MED ORDER — MELOXICAM 15 MG PO TABS: 15.0000 mg | ORAL_TABLET | Freq: Every day | ORAL | 1 refills | Status: DC

## 2016-02-16 NOTE — Progress Notes (Signed)
Pre visit review using our clinic review tool, if applicable. No additional management support is needed unless otherwise documented below in the visit note. 
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
## 2016-02-16 NOTE — Patient Instructions (Signed)
Follow up as needed/scheduled We'll call you with your Ortho appt Start the Meloxicam (Mobic) once daily- take w/ food You can add Tylenol as needed but avoid any other anti-inflammatories (ibuprofen, advil, aleve, etc) ICE!! Call with any questions or concerns Hang in there!!!

## 2016-03-07 ENCOUNTER — Ambulatory Visit: Payer: Self-pay | Admitting: Family Medicine

## 2016-03-07 DIAGNOSIS — Z0289 Encounter for other administrative examinations: Secondary | ICD-10-CM

## 2016-06-14 ENCOUNTER — Other Ambulatory Visit: Payer: Self-pay | Admitting: Family Medicine

## 2017-04-09 ENCOUNTER — Encounter: Payer: Self-pay | Admitting: Family Medicine

## 2017-04-09 ENCOUNTER — Ambulatory Visit: Payer: Managed Care, Other (non HMO) | Admitting: Family Medicine

## 2017-04-09 VITALS — BP 112/60 | HR 84 | Temp 98.9°F | Ht 62.0 in | Wt 147.0 lb

## 2017-04-09 DIAGNOSIS — J219 Acute bronchiolitis, unspecified: Secondary | ICD-10-CM

## 2017-04-09 MED ORDER — ALBUTEROL SULFATE (2.5 MG/3ML) 0.083% IN NEBU
2.5000 mg | INHALATION_SOLUTION | Freq: Once | RESPIRATORY_TRACT | Status: AC
  Administered 2017-04-09: 2.5 mg via RESPIRATORY_TRACT

## 2017-04-09 MED ORDER — ALBUTEROL SULFATE HFA 108 (90 BASE) MCG/ACT IN AERS: 2.0000 | INHALATION_SPRAY | Freq: Four times a day (QID) | RESPIRATORY_TRACT | 0 refills | Status: DC | PRN

## 2017-04-09 MED ORDER — BENZONATATE 200 MG PO CAPS: 200.0000 mg | ORAL_CAPSULE | Freq: Two times a day (BID) | ORAL | 0 refills | Status: DC | PRN

## 2017-04-09 MED ORDER — AZITHROMYCIN 250 MG PO TABS: ORAL_TABLET | ORAL | 0 refills | Status: DC

## 2017-04-09 MED ORDER — PREDNISONE 10 MG PO TABS: ORAL_TABLET | ORAL | 0 refills | Status: DC
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
## 2017-04-09 NOTE — Patient Instructions (Addendum)
Please f/u as needed for this but please schedule an appointment with Dr. Birdie Riddle for your complete physical as soon as possible.  Bronchospasm, Adult Bronchospasm is a tightening of the airways going into the lungs. During an episode, it may be harder to breathe. You may cough, and you may make a whistling sound when you breathe (wheeze). This condition often affects people with asthma. What are the causes? This condition is caused by swelling and irritation in the airways. It can be triggered by:  An infection (common).  Seasonal allergies.  An allergic reaction.  Exercise.  Irritants. These include pollution, cigarette smoke, strong odors, aerosol sprays, and paint fumes.  Weather changes. Winds increase molds and pollens in the air. Cold air may cause swelling.  Stress and emotional upset.  What are the signs or symptoms? Symptoms of this condition include:  Wheezing. If the episode was triggered by an allergy, wheezing may start right away or hours later.  Nighttime coughing.  Frequent or severe coughing with a simple cold.  Chest tightness.  Shortness of breath.  Decreased ability to exercise.  How is this diagnosed? This condition is usually diagnosed with a review of your medical history and a physical exam. Tests, such as lung function tests, are sometimes done to look for other conditions. The need for a chest X-ray depends on where the wheezing occurs and whether it is the first time you have wheezed. How is this treated? This condition may be treated with:  Inhaled medicines. These open up the airways and help you breathe. They can be taken with an inhaler or a nebulizer device.  Corticosteroid medicines. These may be given for severe bronchospasm, usually when it is associated with asthma.  Avoiding triggers, such as irritants, infection, or allergies.  Follow these instructions at home: Medicines  Take over-the-counter and prescription medicines only as  told by your health care provider.  If you need to use an inhaler or nebulizer to take your medicine, ask your health care provider to explain how to use it correctly. If you were given a spacer, always use it with your inhaler. Lifestyle  Reduce the number of triggers in your home. To do this: ? Change your heating and air conditioning filter at least once a month. ? Limit your use of fireplaces and wood stoves. ? Do not smoke. Do not allow smoking in your home. ? Avoid using perfumes and fragrances. ? Get rid of pests, such as roaches and mice, and their droppings. ? Remove any mold from your home. ? Keep your house clean and dust free. Use unscented cleaning products. ? Replace carpet with wood, tile, or vinyl flooring. Carpet can trap dander and dust. ? Use allergy-proof pillows, mattress covers, and box spring covers. ? Wash bed sheets and blankets every week in hot water. Dry them in a dryer. ? Use blankets that are made of polyester or cotton. ? Wash your hands often. ? Do not allow pets in your bedroom.  Avoid breathing in cold air when you exercise. General instructions  Have a plan for seeking medical care. Know when to call your health care provider and local emergency services, and where to get emergency care.  Stay up to date on your immunizations.  When you have an episode of bronchospasm, stay calm. Try to relax and breathe more slowly.  If you have asthma, make sure you have an asthma action plan.  Keep all follow-up visits as told by your health care provider. This  is important. Contact a health care provider if:  You have muscle aches.  You have chest pain.  The mucus that you cough up (sputum) changes from clear or white to yellow, green, gray, or bloody.  You have a fever.  Your sputum gets thicker. Get help right away if:  Your wheezing and coughing get worse, even after you take your prescribed medicines.  It gets even harder to breathe.  You  develop severe chest pain. Summary  Bronchospasm is a tightening of the airways going into the lungs.  During an episode of bronchospasm, you may have a harder time breathing. You may cough and make a whistling sound when you breathe (wheeze).  Avoid exposure to triggers such as smoke, dust, mold, animal dander, and fragrances.  When you have an episode of bronchospasm, stay calm. Try to relax and breathe more slowly. This information is not intended to replace advice given to you by your health care provider. Make sure you discuss any questions you have with your health care provider. Document Released: 04/12/2003 Document Revised: 04/05/2016 Document Reviewed: 04/05/2016 Elsevier Interactive Patient Education  2017 [employer]. .  Acute Bronchitis, Adult Acute bronchitis is when air tubes (bronchi) in the lungs suddenly get swollen. The condition can make it hard to breathe. It can also cause these symptoms:  A cough.  Coughing up clear, yellow, or green mucus.  Wheezing.  Chest congestion.  Shortness of breath.  A fever.  Body aches.  Chills.  A sore throat.  Follow these instructions at home: Medicines  Take over-the-counter and prescription medicines only as told by your doctor.  If you were prescribed an antibiotic medicine, take it as told by your doctor. Do not stop taking the antibiotic even if you start to feel better. General instructions  Rest.  Drink enough fluids to keep your pee (urine) clear or pale yellow.  Avoid smoking and secondhand smoke. If you smoke and you need help quitting, ask your doctor. Quitting will help your lungs heal faster.  Use an inhaler, cool mist vaporizer, or humidifier as told by your doctor.  Keep all follow-up visits as told by your doctor. This is important. How is this prevented? To lower your risk of getting this condition again:  Wash your hands often with soap and water. If you cannot use soap and water, use hand  sanitizer.  Avoid contact with people who have cold symptoms.  Try not to touch your hands to your mouth, nose, or eyes.  Make sure to get the flu shot every year.  Contact a doctor if:  Your symptoms do not get better in 2 weeks. Get help right away if:  You cough up blood.  You have chest pain.  You have very bad shortness of breath.  You become dehydrated.  You faint (pass out) or keep feeling like you are going to pass out.  You keep throwing up (vomiting).  You have a very bad headache.  Your fever or chills gets worse. This information is not intended to replace advice given to you by your health care provider. Make sure you discuss any questions you have with your health care provider. Document Released: 09/26/2007 Document Revised: 11/16/2015 Document Reviewed: 09/28/2015 Elsevier Interactive Patient Education  2017 [employer].

## 2017-07-01 ENCOUNTER — Encounter: Payer: Managed Care, Other (non HMO) | Admitting: Family Medicine

## 2017-10-09 ENCOUNTER — Encounter: Payer: Self-pay | Admitting: Family Medicine

## 2017-10-09 ENCOUNTER — Other Ambulatory Visit: Payer: Self-pay

## 2017-10-09 ENCOUNTER — Ambulatory Visit (INDEPENDENT_AMBULATORY_CARE_PROVIDER_SITE_OTHER): Payer: Managed Care, Other (non HMO) | Admitting: Family Medicine

## 2017-10-09 ENCOUNTER — Other Ambulatory Visit (HOSPITAL_COMMUNITY)
Admission: RE | Admit: 2017-10-09 | Discharge: 2017-10-09 | Disposition: A | Discharge status: Home / Self Care | Payer: Managed Care, Other (non HMO) | Source: Ambulatory Visit | Attending: Family Medicine | Admitting: Family Medicine

## 2017-10-09 VITALS — BP 114/72 | HR 85 | Temp 98.5°F | Resp 16 | Ht 62.0 in | Wt 139.1 lb

## 2017-10-09 DIAGNOSIS — M5416 Radiculopathy, lumbar region: Secondary | ICD-10-CM

## 2017-10-09 DIAGNOSIS — Z0000 Encounter for general adult medical examination without abnormal findings: Secondary | ICD-10-CM | POA: Diagnosis not present

## 2017-10-09 DIAGNOSIS — E559 Vitamin D deficiency, unspecified: Secondary | ICD-10-CM | POA: Diagnosis not present

## 2017-10-09 DIAGNOSIS — Z1231 Encounter for screening mammogram for malignant neoplasm of breast: Secondary | ICD-10-CM | POA: Diagnosis not present

## 2017-10-09 DIAGNOSIS — Z124 Encounter for screening for malignant neoplasm of cervix: Secondary | ICD-10-CM

## 2017-10-09 DIAGNOSIS — E785 Hyperlipidemia, unspecified: Secondary | ICD-10-CM | POA: Diagnosis not present

## 2017-10-09 DIAGNOSIS — F32A Depression, unspecified: Secondary | ICD-10-CM

## 2017-10-09 DIAGNOSIS — F329 Major depressive disorder, single episode, unspecified: Secondary | ICD-10-CM | POA: Diagnosis not present

## 2017-10-09 DIAGNOSIS — F419 Anxiety disorder, unspecified: Secondary | ICD-10-CM

## 2017-10-09 LAB — HEPATIC FUNCTION PANEL
ALBUMIN: 4.4 g/dL (ref 3.5–5.2)
ALK PHOS: 30 U/L — AB (ref 39–117)
ALT: 20 U/L (ref 0–35)
AST: 20 U/L (ref 0–37)
Bilirubin, Direct: 0.1 mg/dL (ref 0.0–0.3)
TOTAL PROTEIN: 7.1 g/dL (ref 6.0–8.3)
Total Bilirubin: 0.5 mg/dL (ref 0.2–1.2)

## 2017-10-09 LAB — CBC WITH DIFFERENTIAL/PLATELET
BASOS ABS: 0 10*3/uL (ref 0.0–0.1)
Basophils Relative: 0.6 % (ref 0.0–3.0)
EOS ABS: 0.1 10*3/uL (ref 0.0–0.7)
Eosinophils Relative: 2.1 % (ref 0.0–5.0)
HCT: 40.1 % (ref 36.0–46.0)
Hemoglobin: 13.8 g/dL (ref 12.0–15.0)
LYMPHS ABS: 1.8 10*3/uL (ref 0.7–4.0)
Lymphocytes Relative: 30.3 % (ref 12.0–46.0)
MCHC: 34.4 g/dL (ref 30.0–36.0)
MCV: 95.5 fl (ref 78.0–100.0)
MONO ABS: 0.4 10*3/uL (ref 0.1–1.0)
MONOS PCT: 6.7 % (ref 3.0–12.0)
NEUTROS PCT: 60.3 % (ref 43.0–77.0)
Neutro Abs: 3.7 10*3/uL (ref 1.4–7.7)
PLATELETS: 261 10*3/uL (ref 150.0–400.0)
RBC: 4.19 Mil/uL (ref 3.87–5.11)
RDW: 13.5 % (ref 11.5–15.5)
WBC: 6.1 10*3/uL (ref 4.0–10.5)

## 2017-10-09 LAB — BASIC METABOLIC PANEL
BUN: 17 mg/dL (ref 6–23)
CO2: 26 mEq/L (ref 19–32)
Calcium: 10 mg/dL (ref 8.4–10.5)
Chloride: 100 mEq/L (ref 96–112)
Creatinine, Ser: 0.86 mg/dL (ref 0.40–1.20)
GFR: 71.58 mL/min (ref >60.00)
GLUCOSE: 105 mg/dL — AB (ref 70–99)
POTASSIUM: 3.8 meq/L (ref 3.5–5.1)
Sodium: 138 mEq/L (ref 135–145)

## 2017-10-09 LAB — LIPID PANEL
Cholesterol: 473 mg/dL — ABNORMAL HIGH (ref 0–200)
HDL: 54.7 mg/dL (ref >39.00)
Total CHOL/HDL Ratio: 9

## 2017-10-09 LAB — LDL CHOLESTEROL, DIRECT: Direct LDL: 178 mg/dL

## 2017-10-09 LAB — VITAMIN D 25 HYDROXY (VIT D DEFICIENCY, FRACTURES): VITD: 36.14 ng/mL (ref 30.00–100.00)

## 2017-10-09 LAB — TSH: TSH: 3.28 u[IU]/mL (ref 0.35–4.50)

## 2017-10-09 MED ORDER — PREDNISONE 10 MG PO TABS: ORAL_TABLET | ORAL | 0 refills | Status: DC

## 2017-10-09 NOTE — Assessment & Plan Note (Signed)
Deteriorated.  Start Prednisone.  Refer back to Dr Nelva Bush as pt previously had injxn that greatly improved her sxs.  Pt expressed understanding and is in agreement w/ plan.

## 2017-10-09 NOTE — Assessment & Plan Note (Signed)
Chronic problem.  Pt has been off medication as she has not had labs in 2 yrs.  Check labs and restart meds prn.

## 2017-10-09 NOTE — Assessment & Plan Note (Signed)
Pt's PE WNL.  UTD on colonoscopy, UTD on Tdap.  Mammo ordered.  Pap done today.  Check labs.  Anticipatory guidance provided.

## 2017-10-09 NOTE — Progress Notes (Signed)
   Subjective:    Patient ID: Kim Edwards, female    DOB: May 16, 1955, 62 y.o.   MRN: [id number]  HPI CPE- UTD on colonoscopy, due for mammo, pap today.  Pt is down 8 lbs since last visit.     Review of Systems Patient reports no vision/ hearing changes, adenopathy,fever, weight change,  persistant/recurrent hoarseness , swallowing issues, chest pain, palpitations, edema, persistant/recurrent cough, hemoptysis, dyspnea (rest/exertional/paroxysmal nocturnal), gastrointestinal bleeding (melena, rectal bleeding), abdominal pain, significant heartburn, bowel changes, GU symptoms (dysuria, hematuria, incontinence), Gyn symptoms (abnormal  bleeding, pain),  syncope, focal weakness, memory loss, numbness & tingling, skin/hair/nail changes, abnormal bruising or bleeding.  + depression- pt and wife are separated but still living in same house, haven't told the kids.  Pt is not interested in medication  Lumbar radiculopathy- pt reports she will have pain and numbness from her R buttock to her R foot for 4-5 hrs/day.  She has previously seen Dr Nelva Bush and had an injxn that provided relief for ~2 yrs.    Objective:   Physical Exam  General Appearance:    Alert, cooperative, no distress, appears stated age  Head:    Normocephalic, without obvious abnormality, atraumatic  Eyes:    PERRL, conjunctiva/corneas clear, EOM's intact, fundi    benign, both eyes  Ears:    Normal TM's and external ear canals, both ears  Nose:   Nares normal, septum midline, mucosa normal, no drainage    or sinus tenderness  Throat:   Lips, mucosa, and tongue normal; teeth and gums normal  Neck:   Supple, symmetrical, trachea midline, no adenopathy;    Thyroid: no enlargement/tenderness/nodules  Back:     Symmetric, no curvature, ROM normal, no CVA tenderness  Lungs:     Clear to auscultation bilaterally, respirations unlabored  Chest Wall:    No tenderness or deformity   Heart:    Regular rate and rhythm, S1 and S2 normal,  no murmur, rub   or gallop  Breast Exam:    No tenderness, masses, or nipple abnormality  Abdomen:     Soft, non-tender, bowel sounds active all four quadrants,    no masses, no organomegaly  Genitalia:    External genitalia normal, cervix normal in appearance, no CMT, uterus in normal size and position, adnexa w/out mass or tenderness, mucosa pink and moist, no lesions or discharge present  Rectal:    Normal external appearance  Extremities:   Extremities normal, atraumatic, no cyanosis or edema  Pulses:   2+ and symmetric all extremities  Skin:   Skin color, texture, turgor normal, no rashes or lesions  Lymph nodes:   Cervical, supraclavicular, and axillary nodes normal  Neurologic:   CNII-XII intact, normal strength, sensation and reflexes    throughout          Assessment & Plan:

## 2017-10-09 NOTE — Assessment & Plan Note (Signed)
New.  Pt is separated from her wife and going through a difficult time.  Offered both medication and counseling but she declined at this time.  She indicated she would let me know if she changed her mind.

## 2017-10-09 NOTE — Patient Instructions (Addendum)
Follow up in 6 months to recheck cholesterol We'll notify you of your lab results and make any changes if needed We'll call you with your mammogram appt! We'll call you with your Ortho appt for Dr Nelva Bush Keep up the good work on [employer] and regular exercise- you're doing great! START the Prednisone as directed for the back pain- 3 tabs at the same time x3 days and then 2 tabs at the same time x3 days and then 1 tab daily If no improvement in your hand, please let me know so we can refer to hand specialist Call with any questions or concerns Hang in there!  You can do this!!!

## 2017-10-09 NOTE — Assessment & Plan Note (Signed)
Pap collected. 

## 2017-10-09 NOTE — Assessment & Plan Note (Signed)
Pt has hx of this.  Check labs and replete prn. 

## 2017-10-10 ENCOUNTER — Other Ambulatory Visit: Payer: Self-pay | Admitting: General Practice

## 2017-10-10 ENCOUNTER — Encounter: Payer: Self-pay | Admitting: General Practice

## 2017-10-10 MED ORDER — ROSUVASTATIN CALCIUM 20 MG PO TABS: 20.0000 mg | ORAL_TABLET | Freq: Every day | ORAL | 1 refills | Status: DC

## 2017-10-10 MED ORDER — FENOFIBRATE 160 MG PO TABS: 160.0000 mg | ORAL_TABLET | Freq: Every day | ORAL | 1 refills | Status: DC

## 2017-10-13 LAB — CYTOLOGY - PAP
Adequacy: ABSENT
DIAGNOSIS: NEGATIVE
HPV: NOT DETECTED

## 2017-10-14 ENCOUNTER — Telehealth: Payer: Self-pay | Admitting: Family Medicine

## 2017-10-14 NOTE — Telephone Encounter (Signed)
Copied from [place] [id number]. Topic: Appointment Scheduling - Scheduling Inquiry for Clinic >> Oct 14, 2017  3:11 PM Gardiner Ramus wrote: Reason for CRM:pt called and stated that she would like to schedule her pap 04/02/18. She is already scheduled for an appointment that same day. Please advise.

## 2017-10-14 NOTE — Telephone Encounter (Signed)
Ok to schedule pap if we have an opening. Make her appt 30 minutes.

## 2017-10-15 NOTE — Telephone Encounter (Signed)
Changed appointment to 30 min, added pap smear to appointment notes.

## 2017-11-05 ENCOUNTER — Telehealth: Payer: Self-pay | Admitting: Family Medicine

## 2017-11-05 DIAGNOSIS — M79643 Pain in unspecified hand: Secondary | ICD-10-CM

## 2017-11-05 DIAGNOSIS — R200 Anesthesia of skin: Secondary | ICD-10-CM

## 2017-11-05 NOTE — Telephone Encounter (Signed)
Copied from [place] [phone]. Topic: Referral - Request >> Nov 05, 2017 12:41 PM Cecelia Byars, NT wrote: Reason for CRM: Patient called and would like a referral to a hand Dr , per visit with Dr Birdie Riddle , she states her hand is now numb, it also is painful the top of the lump to touch , she says it has not gone down at all  please call her at [phone]

## 2017-11-05 NOTE — Telephone Encounter (Signed)
Please advise 

## 2017-11-06 DIAGNOSIS — M5136 Other intervertebral disc degeneration, lumbar region: Secondary | ICD-10-CM

## 2017-11-06 DIAGNOSIS — M51369 Other intervertebral disc degeneration, lumbar region without mention of lumbar back pain or lower extremity pain: Secondary | ICD-10-CM

## 2017-11-06 HISTORY — DX: Other intervertebral disc degeneration, lumbar region without mention of lumbar back pain or lower extremity pain: M51.369

## 2017-11-06 HISTORY — DX: Other intervertebral disc degeneration, lumbar region: M51.36

## 2017-11-06 NOTE — Telephone Encounter (Signed)
[facility] for referral to hand specialist for hand pain and numbness

## 2017-11-06 NOTE — Telephone Encounter (Signed)
Referral was placed 

## 2017-11-13 ENCOUNTER — Encounter: Payer: Self-pay | Admitting: Emergency Medicine

## 2017-11-13 ENCOUNTER — Ambulatory Visit
Admission: RE | Admit: 2017-11-13 | Discharge: 2017-11-13 | Disposition: A | Discharge status: Home / Self Care | Payer: Managed Care, Other (non HMO) | Source: Ambulatory Visit | Attending: Family Medicine | Admitting: Family Medicine

## 2017-11-13 DIAGNOSIS — Z1231 Encounter for screening mammogram for malignant neoplasm of breast: Secondary | ICD-10-CM

## 2017-11-26 ENCOUNTER — Other Ambulatory Visit: Payer: Self-pay | Admitting: Orthopedic Surgery

## 2017-11-26 DIAGNOSIS — R229 Localized swelling, mass and lump, unspecified: Principal | ICD-10-CM

## 2017-11-26 DIAGNOSIS — IMO0002 Reserved for concepts with insufficient information to code with codable children: Secondary | ICD-10-CM

## 2018-04-02 ENCOUNTER — Ambulatory Visit: Payer: Managed Care, Other (non HMO) | Admitting: Family Medicine

## 2018-05-02 ENCOUNTER — Ambulatory Visit (INDEPENDENT_AMBULATORY_CARE_PROVIDER_SITE_OTHER): Payer: Managed Care, Other (non HMO) | Admitting: Family Medicine

## 2018-05-02 ENCOUNTER — Encounter: Payer: Self-pay | Admitting: Family Medicine

## 2018-05-02 ENCOUNTER — Other Ambulatory Visit: Payer: Self-pay

## 2018-05-02 VITALS — BP 110/76 | HR 70 | Temp 98.1°F | Resp 16 | Ht 62.0 in | Wt 141.1 lb

## 2018-05-02 DIAGNOSIS — F32A Depression, unspecified: Secondary | ICD-10-CM

## 2018-05-02 DIAGNOSIS — R2230 Localized swelling, mass and lump, unspecified upper limb: Secondary | ICD-10-CM | POA: Diagnosis not present

## 2018-05-02 DIAGNOSIS — M7989 Other specified soft tissue disorders: Secondary | ICD-10-CM

## 2018-05-02 DIAGNOSIS — F329 Major depressive disorder, single episode, unspecified: Secondary | ICD-10-CM | POA: Diagnosis not present

## 2018-05-02 DIAGNOSIS — F419 Anxiety disorder, unspecified: Secondary | ICD-10-CM | POA: Diagnosis not present

## 2018-05-02 DIAGNOSIS — E785 Hyperlipidemia, unspecified: Secondary | ICD-10-CM | POA: Diagnosis not present

## 2018-05-02 LAB — BASIC METABOLIC PANEL
BUN: 16 mg/dL (ref 6–23)
CALCIUM: 9.4 mg/dL (ref 8.4–10.5)
CO2: 27 mEq/L (ref 19–32)
CREATININE: 0.78 mg/dL (ref 0.40–1.20)
Chloride: 101 mEq/L (ref 96–112)
GFR: 79.96 mL/min (ref >60.00)
Glucose, Bld: 127 mg/dL — ABNORMAL HIGH (ref 70–99)
Potassium: 3.8 mEq/L (ref 3.5–5.1)
Sodium: 138 mEq/L (ref 135–145)

## 2018-05-02 LAB — LIPID PANEL
Cholesterol: 236 mg/dL — ABNORMAL HIGH (ref 0–200)
HDL: 86.1 mg/dL (ref >39.00)
NonHDL: 149.92
TRIGLYCERIDES: 259 mg/dL — AB (ref 0.0–149.0)
Total CHOL/HDL Ratio: 3
VLDL: 51.8 mg/dL — ABNORMAL HIGH (ref 0.0–40.0)

## 2018-05-02 LAB — HEPATIC FUNCTION PANEL
ALT: 19 U/L (ref 0–35)
AST: 27 U/L (ref 0–37)
Albumin: 4.4 g/dL (ref 3.5–5.2)
Alkaline Phosphatase: 23 U/L — ABNORMAL LOW (ref 39–117)
BILIRUBIN TOTAL: 0.8 mg/dL (ref 0.2–1.2)
Bilirubin, Direct: 0.2 mg/dL (ref 0.0–0.3)
Total Protein: 6.9 g/dL (ref 6.0–8.3)

## 2018-05-02 LAB — LDL CHOLESTEROL, DIRECT: Direct LDL: 123 mg/dL

## 2018-05-02 NOTE — Patient Instructions (Signed)
Schedule your complete physical w/ pap in 6 months We'll notify you of your lab results and make any changes if needed Let me know if you change your mind on meds Keep up the good work!  You look great! Call with any questions or concerns Happy New Year!

## 2018-05-02 NOTE — Progress Notes (Signed)
   Subjective:    Patient ID: Kim Edwards, female    DOB: 1955/06/25, 63 y.o.   MRN: [id number]  HPI Hyperlipidemia- chronic problem, on Crestor 20mg  and Fenofibrate 160mg  daily. Denies CP, SOB, abd pain, N/V.  Anxiety/depression- ongoing issue.  She and her wife have been separated for over a year but still living in the same house.  Pt is under a lot of stress at work as well.  'I just hate taking drugs'.    Soft tissue mass- R ventral forearm.  First noticed ~3 months ago.  Seems to be enlarging.  Not painful.   Review of Systems For ROS see HPI     Objective:   Physical Exam Vitals signs reviewed.  Constitutional:      General: She is not in acute distress.    Appearance: She is well-developed.  HENT:     Head: Normocephalic and atraumatic.  Eyes:     Conjunctiva/sclera: Conjunctivae normal.     Pupils: Pupils are equal, round, and reactive to light.  Neck:     Musculoskeletal: Normal range of motion and neck supple.     Thyroid: No thyromegaly.  Cardiovascular:     Rate and Rhythm: Normal rate and regular rhythm.     Heart sounds: Normal heart sounds. No murmur.  Pulmonary:     Effort: Pulmonary effort is normal. No respiratory distress.     Breath sounds: Normal breath sounds.  Abdominal:     General: There is no distension.     Palpations: Abdomen is soft.     Tenderness: There is no abdominal tenderness.  Musculoskeletal:     Comments: 2"x1" ovoid soft tissue mass of R ventral forearm  Lymphadenopathy:     Cervical: No cervical adenopathy.  Skin:    General: Skin is warm and dry.  Neurological:     Mental Status: She is alert and oriented to person, place, and time.  Psychiatric:        Behavior: Behavior normal.           Assessment & Plan:  Soft tissue mass- new.  Will assess via [place] and determine next steps.  Pt expressed understanding and is in agreement w/ plan.

## 2018-05-02 NOTE — Assessment & Plan Note (Signed)
Chronic problem.  Tolerating statin w/o difficulty.  Check labs.  Adjust meds prn  

## 2018-05-02 NOTE — Assessment & Plan Note (Signed)
Deteriorated.  Pt and wife have been separated for over a year but still living under the same roof.  This is obviously very stressful.  She admits that she probably needs medication but 'would like to hold off as long as possible'.  Pt knows to contact me regarding medication if she changes her mind.  Will follow.

## 2018-05-05 ENCOUNTER — Encounter: Payer: Self-pay | Admitting: General Practice

## 2018-05-05 ENCOUNTER — Other Ambulatory Visit (INDEPENDENT_AMBULATORY_CARE_PROVIDER_SITE_OTHER): Payer: Managed Care, Other (non HMO)

## 2018-05-05 DIAGNOSIS — R7309 Other abnormal glucose: Secondary | ICD-10-CM

## 2018-05-05 LAB — HEMOGLOBIN A1C: Hgb A1c MFr Bld: 5.9 % (ref 4.6–6.5)

## 2018-06-04 ENCOUNTER — Ambulatory Visit
Admission: RE | Admit: 2018-06-04 | Discharge: 2018-06-04 | Disposition: A | Discharge status: Home / Self Care | Payer: Managed Care, Other (non HMO) | Source: Ambulatory Visit | Attending: Family Medicine | Admitting: Family Medicine

## 2018-06-04 DIAGNOSIS — M7989 Other specified soft tissue disorders: Secondary | ICD-10-CM

## 2018-06-04 DIAGNOSIS — R2230 Localized swelling, mass and lump, unspecified upper limb: Principal | ICD-10-CM

## 2018-06-06 ENCOUNTER — Other Ambulatory Visit: Payer: Self-pay | Admitting: Family Medicine

## 2018-07-02 ENCOUNTER — Ambulatory Visit: Payer: Managed Care, Other (non HMO) | Admitting: Family Medicine

## 2018-07-02 ENCOUNTER — Encounter: Payer: Self-pay | Admitting: Family Medicine

## 2018-07-02 ENCOUNTER — Encounter: Payer: Self-pay | Admitting: General Practice

## 2018-07-02 ENCOUNTER — Other Ambulatory Visit: Payer: Self-pay

## 2018-07-02 VITALS — BP 132/82 | HR 78 | Temp 98.6°F | Resp 17 | Ht 62.0 in | Wt 141.0 lb

## 2018-07-02 DIAGNOSIS — B9689 Other specified bacterial agents as the cause of diseases classified elsewhere: Secondary | ICD-10-CM

## 2018-07-02 DIAGNOSIS — J329 Chronic sinusitis, unspecified: Secondary | ICD-10-CM | POA: Diagnosis not present

## 2018-07-02 MED ORDER — AMOXICILLIN 875 MG PO TABS: 875.0000 mg | ORAL_TABLET | Freq: Two times a day (BID) | ORAL | 0 refills | Status: DC

## 2018-07-02 NOTE — Patient Instructions (Signed)
Follow up as needed or as scheduled START the Amoxicillin twice daily- take w/ food Drink plenty of fluids Mucinex DM for cough/chest congestion Robitussin or Delsym for cough START your daily allergy medication if you haven't already Call with any questions or concerns Hang in there!

## 2018-07-02 NOTE — Progress Notes (Signed)
   Subjective:    Patient ID: Kim Edwards, female    DOB: 11/22/1955, 63 y.o.   MRN: [id number]  HPI URI- sxs started 5-6 days ago.  + bloody nasal drainage, laryngitis, sore throat, bilateral ear pain, chest congestion.  + cough- wet but not productive.  + HA.  No tooth pain.  No N/V.  + sick contacts.   Review of Systems For ROS see HPI     Objective:   Physical Exam Vitals signs reviewed.  Constitutional:      General: She is not in acute distress.    Appearance: She is well-developed.  HENT:     Head: Normocephalic and atraumatic.     Right Ear: Tympanic membrane normal.     Left Ear: Tympanic membrane normal.     Nose: Mucosal edema and rhinorrhea present.     Right Sinus: Maxillary sinus tenderness present. No frontal sinus tenderness.     Left Sinus: Maxillary sinus tenderness present. No frontal sinus tenderness.     Mouth/Throat:     Pharynx: Uvula midline. Posterior oropharyngeal erythema present. No oropharyngeal exudate.  Eyes:     Conjunctiva/sclera: Conjunctivae normal.     Pupils: Pupils are equal, round, and reactive to light.  Neck:     Musculoskeletal: Normal range of motion and neck supple.  Cardiovascular:     Rate and Rhythm: Normal rate and regular rhythm.     Heart sounds: Normal heart sounds.  Pulmonary:     Effort: Pulmonary effort is normal. No respiratory distress.     Breath sounds: Normal breath sounds. No wheezing.  Lymphadenopathy:     Cervical: No cervical adenopathy.           Assessment & Plan:  Bacterial sinusitis- new.  Pt's sxs and PE consistent w/ infxn.  Start abx.  Reviewed supportive care and red flags that should prompt return.  Pt expressed understanding and is in agreement w/ plan.

## 2018-09-26 ENCOUNTER — Other Ambulatory Visit: Payer: Self-pay

## 2018-09-26 ENCOUNTER — Encounter (HOSPITAL_BASED_OUTPATIENT_CLINIC_OR_DEPARTMENT_OTHER): Payer: Self-pay

## 2018-09-26 ENCOUNTER — Emergency Department (HOSPITAL_BASED_OUTPATIENT_CLINIC_OR_DEPARTMENT_OTHER)
Admission: EM | Admit: 2018-09-26 | Discharge: 2018-09-26 | Disposition: A | Discharge status: Home / Self Care | Payer: Managed Care, Other (non HMO) | Attending: Emergency Medicine | Admitting: Emergency Medicine

## 2018-09-26 ENCOUNTER — Emergency Department (HOSPITAL_BASED_OUTPATIENT_CLINIC_OR_DEPARTMENT_OTHER): Payer: Managed Care, Other (non HMO)

## 2018-09-26 DIAGNOSIS — Z8541 Personal history of malignant neoplasm of cervix uteri: Secondary | ICD-10-CM | POA: Insufficient documentation

## 2018-09-26 DIAGNOSIS — M25551 Pain in right hip: Secondary | ICD-10-CM | POA: Diagnosis not present

## 2018-09-26 DIAGNOSIS — Z87891 Personal history of nicotine dependence: Secondary | ICD-10-CM | POA: Diagnosis not present

## 2018-09-26 DIAGNOSIS — Z79899 Other long term (current) drug therapy: Secondary | ICD-10-CM | POA: Diagnosis not present

## 2018-09-26 MED ORDER — PREDNISONE 50 MG PO TABS: 50.0000 mg | ORAL_TABLET | Freq: Every day | ORAL | 0 refills | Status: DC

## 2018-09-26 MED ORDER — CYCLOBENZAPRINE HCL 10 MG PO TABS: 10.0000 mg | ORAL_TABLET | Freq: Two times a day (BID) | ORAL | 0 refills | Status: DC | PRN

## 2018-09-26 MED ORDER — TRAMADOL HCL 50 MG PO TABS: 50.0000 mg | ORAL_TABLET | Freq: Four times a day (QID) | ORAL | 0 refills | Status: DC | PRN
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
## 2018-09-26 NOTE — Discharge Instructions (Addendum)
Take the medications as prescribed, follow-up with your primary care doctor for further evaluation if the symptoms do not improve

## 2018-09-26 NOTE — ED Triage Notes (Signed)
Pt reports chronic low back pain, but beginning 2-3 days ago began experiencing right hip/groin pain making it difficult to go up and down stairs.  Able to ambulate, with pain, decreased ROM. Took alleve early this morning with minimal improvement.

## 2018-10-07 ENCOUNTER — Telehealth: Payer: Self-pay | Admitting: Family Medicine

## 2018-10-07 NOTE — Telephone Encounter (Signed)
Pt called in asking for a refills on the Fenofibrate and Rosuvastatin pt can be reached at the home # she has an appt with Tabori on 10/15/18 but is out of these medications. Pt can be reached at the home # and uses [facility] on [address].   She is aware that KT is already gone for the day.

## 2018-10-08 MED ORDER — FENOFIBRATE 160 MG PO TABS: 160.0000 mg | ORAL_TABLET | Freq: Every day | ORAL | 0 refills | Status: DC

## 2018-10-08 MED ORDER — ROSUVASTATIN CALCIUM 20 MG PO TABS: 20.0000 mg | ORAL_TABLET | Freq: Every day | ORAL | 0 refills | Status: DC

## 2018-10-08 NOTE — Telephone Encounter (Signed)
Medication filled to pharmacy as requested.   

## 2018-10-08 NOTE — Addendum Note (Signed)
Addended by: Davis Gourd on: 10/08/2018 09:06 AM   Modules accepted: Orders

## 2018-10-15 ENCOUNTER — Ambulatory Visit (INDEPENDENT_AMBULATORY_CARE_PROVIDER_SITE_OTHER): Payer: Managed Care, Other (non HMO) | Admitting: Family Medicine

## 2018-10-15 ENCOUNTER — Other Ambulatory Visit: Payer: Self-pay

## 2018-10-15 ENCOUNTER — Encounter: Payer: Self-pay | Admitting: Family Medicine

## 2018-10-15 ENCOUNTER — Encounter: Payer: Managed Care, Other (non HMO) | Admitting: Family Medicine

## 2018-10-15 VITALS — BP 126/76 | HR 78 | Temp 97.9°F | Resp 16 | Ht 62.0 in | Wt 139.0 lb

## 2018-10-15 DIAGNOSIS — M5416 Radiculopathy, lumbar region: Secondary | ICD-10-CM | POA: Diagnosis not present

## 2018-10-15 DIAGNOSIS — E785 Hyperlipidemia, unspecified: Secondary | ICD-10-CM

## 2018-10-15 DIAGNOSIS — Z0000 Encounter for general adult medical examination without abnormal findings: Secondary | ICD-10-CM

## 2018-10-15 DIAGNOSIS — E559 Vitamin D deficiency, unspecified: Secondary | ICD-10-CM

## 2018-10-15 LAB — LIPID PANEL
Cholesterol: 234 mg/dL — ABNORMAL HIGH (ref 0–200)
HDL: 76.6 mg/dL (ref >39.00)
NonHDL: 157.68
Total CHOL/HDL Ratio: 3
Triglycerides: 312 mg/dL — ABNORMAL HIGH (ref 0.0–149.0)
VLDL: 62.4 mg/dL — ABNORMAL HIGH (ref 0.0–40.0)

## 2018-10-15 LAB — CBC WITH DIFFERENTIAL/PLATELET
Basophils Absolute: 0.1 10*3/uL (ref 0.0–0.1)
Basophils Relative: 1.5 % (ref 0.0–3.0)
Eosinophils Absolute: 0.2 10*3/uL (ref 0.0–0.7)
Eosinophils Relative: 2.8 % (ref 0.0–5.0)
HCT: 41 % (ref 36.0–46.0)
Hemoglobin: 13.6 g/dL (ref 12.0–15.0)
Lymphocytes Relative: 37.9 % (ref 12.0–46.0)
Lymphs Abs: 2.1 10*3/uL (ref 0.7–4.0)
MCHC: 33.1 g/dL (ref 30.0–36.0)
MCV: 97.6 fl (ref 78.0–100.0)
Monocytes Absolute: 0.5 10*3/uL (ref 0.1–1.0)
Monocytes Relative: 9.5 % (ref 3.0–12.0)
Neutro Abs: 2.7 10*3/uL (ref 1.4–7.7)
Neutrophils Relative %: 48.3 % (ref 43.0–77.0)
Platelets: 260 10*3/uL (ref 150.0–400.0)
RBC: 4.2 Mil/uL (ref 3.87–5.11)
RDW: 12.9 % (ref 11.5–15.5)
WBC: 5.6 10*3/uL (ref 4.0–10.5)

## 2018-10-15 LAB — HEPATIC FUNCTION PANEL
ALT: 22 U/L (ref 0–35)
AST: 28 U/L (ref 0–37)
Albumin: 4.2 g/dL (ref 3.5–5.2)
Alkaline Phosphatase: 24 U/L — ABNORMAL LOW (ref 39–117)
Bilirubin, Direct: 0.1 mg/dL (ref 0.0–0.3)
Total Bilirubin: 0.6 mg/dL (ref 0.2–1.2)
Total Protein: 6.5 g/dL (ref 6.0–8.3)

## 2018-10-15 LAB — BASIC METABOLIC PANEL
BUN: 18 mg/dL (ref 6–23)
CO2: 26 mEq/L (ref 19–32)
Calcium: 8.9 mg/dL (ref 8.4–10.5)
Chloride: 104 mEq/L (ref 96–112)
Creatinine, Ser: 0.71 mg/dL (ref 0.40–1.20)
GFR: 83.73 mL/min (ref >60.00)
Glucose, Bld: 98 mg/dL (ref 70–99)
Potassium: 4 mEq/L (ref 3.5–5.1)
Sodium: 140 mEq/L (ref 135–145)

## 2018-10-15 LAB — TSH: TSH: 3.39 u[IU]/mL (ref 0.35–4.50)

## 2018-10-15 LAB — VITAMIN D 25 HYDROXY (VIT D DEFICIENCY, FRACTURES): VITD: 42.08 ng/mL (ref 30.00–100.00)

## 2018-10-15 LAB — LDL CHOLESTEROL, DIRECT: Direct LDL: 126 mg/dL

## 2018-10-15 MED ORDER — TRAZODONE HCL 50 MG PO TABS: 25.0000 mg | ORAL_TABLET | Freq: Every evening | ORAL | 3 refills | Status: DC | PRN

## 2018-10-15 NOTE — Assessment & Plan Note (Signed)
Pt's PE WNL.  UTD on pap, mammo, colonoscopy.  Check labs.  Anticipatory guidance provided.  

## 2018-10-15 NOTE — Patient Instructions (Signed)
Follow up in 6 months to recheck cholesterol We'll notify you of your lab results and make any changes if needed Continue to work on healthy diet and regular exercise- you look great!! START the Trazodone nightly for sleep.  You can start w/ 1/2 tab and increase to 1 tab if needed CALL Dr Nelva Bush and ask about next steps Call with any questions or concerns Stay Safe! Hang in there!!!

## 2018-10-15 NOTE — Assessment & Plan Note (Signed)
Chronic problem, tolerating statin w/o difficulty.  Check labs.  Adjust meds prn  

## 2018-10-15 NOTE — Assessment & Plan Note (Signed)
Deteriorated.  Pt is barely making it through her work day.  Encouraged her to call Dr Nelva Bush and schedule appt to decide next steps.  Pt expressed understanding and is in agreement w/ plan.

## 2018-10-15 NOTE — Assessment & Plan Note (Signed)
Pt has hx of this.  Check labs and replete prn. 

## 2018-10-15 NOTE — Progress Notes (Signed)
   Subjective:    Patient ID: Kim Edwards, female    DOB: 1955/11/05, 63 y.o.   MRN: [id number]  HPI CPE- UTD on pap, mammo, colonoscopy, Tdap.   Review of Systems Patient reports no vision/ hearing changes, adenopathy,fever, weight change,  persistant/recurrent hoarseness , swallowing issues, chest pain, palpitations, edema, persistant/recurrent cough, hemoptysis, dyspnea (rest/exertional/paroxysmal nocturnal), gastrointestinal bleeding (melena, rectal bleeding), abdominal pain, significant heartburn, bowel changes, GU symptoms (dysuria, hematuria, incontinence), Gyn symptoms (abnormal  bleeding, pain),  syncope, memory loss, skin/hair/nail changes, abnormal bruising or bleeding.   + lumbar radiculopathy- has seen Dr Nelva Bush, insurance doesn't cover injxn.  Is 'barely making it through work'.  Wilburn Mylar 'entire R side was numb down to the ankle'.  'I can't sit, stand, sleep'.  + depression- pt is not willing to treat at this time    Objective:   Physical Exam General Appearance:    Alert, cooperative, no distress, appears stated age  Head:    Normocephalic, without obvious abnormality, atraumatic  Eyes:    PERRL, conjunctiva/corneas clear, EOM's intact, fundi    benign, both eyes  Ears:    Normal TM's and external ear canals, both ears  Nose:   Nares normal, septum midline, mucosa normal, no drainage    or sinus tenderness  Throat:   Lips, mucosa, and tongue normal; teeth and gums normal  Neck:   Supple, symmetrical, trachea midline, no adenopathy;    Thyroid: no enlargement/tenderness/nodules  Back:     Symmetric, no curvature, ROM normal, no CVA tenderness  Lungs:     Clear to auscultation bilaterally, respirations unlabored  Chest Wall:    No tenderness or deformity   Heart:    Regular rate and rhythm, S1 and S2 normal, no murmur, rub   or gallop  Breast Exam:    Deferred to mammo  Abdomen:     Soft, non-tender, bowel sounds active all four quadrants,    no masses, no  organomegaly  Genitalia:    Deferred  Rectal:    Extremities:   Extremities normal, atraumatic, no cyanosis or edema.  Lipoma on R forearm  Pulses:   2+ and symmetric all extremities  Skin:   Skin color, texture, turgor normal, no rashes or lesions  Lymph nodes:   Cervical, supraclavicular, and axillary nodes normal  Neurologic:   CNII-XII intact, normal strength, sensation and reflexes    throughout          Assessment & Plan:

## 2018-12-05 ENCOUNTER — Telehealth: Payer: Self-pay | Admitting: Family Medicine

## 2018-12-05 NOTE — Telephone Encounter (Signed)
Patient will be faxing presurgical clearance form from the back surgeon. Thank you

## 2018-12-09 ENCOUNTER — Telehealth: Payer: Self-pay | Admitting: Family Medicine

## 2018-12-09 DIAGNOSIS — Z01818 Encounter for other preprocedural examination: Secondary | ICD-10-CM

## 2018-12-09 NOTE — Telephone Encounter (Signed)
Pt is having ALIF/discectomy procedure. Last CPE 10/15/18 no EKG on file. Paperwork placed in PCP folder for review.

## 2018-12-09 NOTE — Telephone Encounter (Signed)
I have placed a surgical form in the bin up front with a charge sheet.

## 2018-12-16 NOTE — Telephone Encounter (Signed)
We can schedule nurse visit for EKG and then I can sign papers

## 2018-12-16 NOTE — Telephone Encounter (Signed)
I have made pt aware of this.  

## 2018-12-16 NOTE — Telephone Encounter (Signed)
EKG is ordered. Ok to schedule a nurse visit on a day Dr. Birdie Riddle is here.

## 2018-12-18 ENCOUNTER — Telehealth: Payer: Self-pay

## 2018-12-18 NOTE — Telephone Encounter (Signed)
Patient called in stating that she needs an EKG for surgery clearance for upcoming back surgery. The only days that work for her schedule around work are tomorrow after 3 or anytime Tuesday. I informed patient that PCP does not have any availability during those times, but I would route note to PCP and Elyn Aquas to see if he is OK with seeing her for surgery clearance appt.

## 2018-12-18 NOTE — Telephone Encounter (Signed)
That is fine with me as long as she passes screening and there is an appropriate time slot for this.

## 2018-12-18 NOTE — Telephone Encounter (Signed)
She doesn't need a surgical clearance appt....she just needs a nurse visit EKG (I just recently did her physical)

## 2018-12-18 NOTE — Telephone Encounter (Signed)
I will keep appt on Tuesday at 2:30 but change to nurse visit for EKG.

## 2018-12-18 NOTE — Telephone Encounter (Signed)
Patient has been scheduled

## 2018-12-23 ENCOUNTER — Other Ambulatory Visit: Payer: Self-pay

## 2018-12-23 ENCOUNTER — Ambulatory Visit (INDEPENDENT_AMBULATORY_CARE_PROVIDER_SITE_OTHER): Payer: Managed Care, Other (non HMO)

## 2018-12-23 DIAGNOSIS — Z01818 Encounter for other preprocedural examination: Secondary | ICD-10-CM

## 2019-01-21 ENCOUNTER — Ambulatory Visit: Payer: Self-pay | Admitting: Orthopedic Surgery

## 2019-01-22 ENCOUNTER — Other Ambulatory Visit: Payer: Self-pay | Admitting: *Deleted

## 2019-01-25 ENCOUNTER — Other Ambulatory Visit: Payer: Self-pay | Admitting: Family Medicine

## 2019-02-05 ENCOUNTER — Other Ambulatory Visit: Payer: Self-pay

## 2019-02-10 ENCOUNTER — Encounter: Payer: Self-pay | Admitting: Vascular Surgery

## 2019-02-10 ENCOUNTER — Ambulatory Visit (INDEPENDENT_AMBULATORY_CARE_PROVIDER_SITE_OTHER): Payer: Managed Care, Other (non HMO) | Admitting: Vascular Surgery

## 2019-02-10 ENCOUNTER — Other Ambulatory Visit: Payer: Self-pay

## 2019-02-10 VITALS — BP 132/66 | HR 71 | Temp 97.8°F | Resp 20 | Ht 62.0 in | Wt 143.9 lb

## 2019-02-10 DIAGNOSIS — M5136 Other intervertebral disc degeneration, lumbar region: Secondary | ICD-10-CM

## 2019-02-10 NOTE — Progress Notes (Signed)
Vascular and Vein Specialist of [facility]  Patient name: Kim Edwards MRN: [id number] DOB: 17-May-1955 Sex: female  REASON FOR CONSULT: Discuss anterior exposure for L4-5 lumbar fusion with Dr. Rolena Infante  HPI: Kim Edwards is a 63 y.o. female, who is here today for discussion of anterior exposure for L4-5 fusion.  She has had a several year history of progressively severe back pain.  This extends into her buttocks and into her legs bilaterally.  She is otherwise extremely healthy and very active.  She reports this is making it difficult for her to walk.  She is a Freight forwarder at a [employer] and also makes it very difficult for her to do her job since she is on her feet up to 14 hours/day.  She has had no prior intra-abdominal surgery.  Has no cardiac history.  Has no history of peripheral vascular occlusive disease  Past Medical History:  Diagnosis Date  . Chronic back pain   . Degeneration of lumbar intervertebral disc 11/06/2017  . 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 History of cervical cancer 1988  . Hyperlipidemia   . Joint pain   . Numbness and tingling of right leg     Family History  Problem Relation Age of Onset  . Heart disease Mother        quad bypass  . Diabetes Mother   . Heart attack Mother   . Heart disease Father        bypass x2  . Diabetes Father   . Heart attack Father   . Hypertension Father   . Colon cancer Neg Hx     SOCIAL HISTORY: Social History   Socioeconomic History  . Marital status: Single    Spouse name: Not on file  . Number of children: Not on file  . Years of education: Not on file  . Highest education level: Not on file  Occupational History  . Not on file  Social Needs  . Financial resource strain: Not on file  . Food insecurity    Worry: Not on file    Inability: Not on file  . Transportation needs    Medical: Not on file    Non-medical: Not on  file  Tobacco Use  . Smoking status: Former Smoker    Quit date: 04/23/2000    Years since quitting: 18.8  . Smokeless tobacco: Never Used  Substance and Sexual Activity  . Alcohol use: Yes    Alcohol/week: 0.0 standard drinks    Comment: 1-1.5 glasses of wine daily  . Drug use: No  . Sexual activity: Not on file  Lifestyle  . Physical activity    Days per week: Not on file    Minutes per session: Not on file  . Stress: Not on file  Relationships  . Social Herbalist on phone: Not on file    Gets together: Not on file    Attends religious service: Not on file    Active member of club or organization: Not on file    Attends meetings of clubs or organizations: Not on file    Relationship status: Not on file  . Intimate partner violence    Fear of current or ex partner: Not on file    Emotionally abused: Not on file    Physically abused: Not on file    Forced sexual activity: Not on file  Other Topics Concern  . Not on file  Social History Narrative  . Not on file    Allergies  Allergen Reactions  . Buspar [Buspirone Hcl]     Joints hurt   . Oxycodone-Acetaminophen     REACTION: upset stomach    Current Outpatient Medications  Medication Sig Dispense Refill  . fenofibrate 160 MG tablet TAKE ONE TABLET BY MOUTH DAILY 90 tablet 0  . gabapentin (NEURONTIN) 300 MG capsule gabapentin 300 mg capsule  Take 1 capsule every day by oral route at bedtime for 30 days.    Marland Kitchen HYDROcodone-acetaminophen (NORCO/VICODIN) 5-325 MG tablet     . rosuvastatin (CRESTOR) 20 MG tablet TAKE ONE TABLET BY MOUTH DAILY 90 tablet 0  . traZODone (DESYREL) 50 MG tablet Take 0.5-1 tablets (25-50 mg total) by mouth at bedtime as needed for sleep. 30 tablet 3  . cyclobenzaprine (FLEXERIL) 10 MG tablet Take 1 tablet (10 mg total) by mouth 2 (two) times daily as needed for muscle spasms. (Patient not taking: Reported on 02/10/2019) 14 tablet 0  . predniSONE (DELTASONE) 50 MG tablet Take 1 tablet  (50 mg total) by mouth daily. (Patient not taking: Reported on 02/10/2019) 5 tablet 0  . traMADol (ULTRAM) 50 MG tablet Take 1 tablet (50 mg total) by mouth every 6 (six) hours as needed. (Patient not taking: Reported on 02/10/2019) 15 tablet 0   No current facility-administered medications for this visit.     REVIEW OF SYSTEMS:  [X]  denotes positive finding, [ ]  denotes negative finding Cardiac  Comments:  Chest pain or chest pressure:    Shortness of breath upon exertion:    Short of breath when lying flat:    Irregular heart rhythm:        Vascular    Pain in calf, thigh, or hip brought on by ambulation:    Pain in feet at night that wakes you up from your sleep:     Blood clot in your veins:    Leg swelling:         Pulmonary    Oxygen at home:    Productive cough:     Wheezing:         Neurologic    Sudden weakness in arms or legs:     Sudden numbness in arms or legs:     Sudden onset of difficulty speaking or slurred speech:    Temporary loss of vision in one eye:     Problems with dizziness:         Gastrointestinal    Blood in stool:     Vomited blood:         Genitourinary    Burning when urinating:     Blood in urine:        Psychiatric    Major depression:         Hematologic    Bleeding problems:    Problems with blood clotting too easily:        Skin    Rashes or ulcers:        Constitutional    Fever or chills:      PHYSICAL EXAM: Vitals:   02/10/19 1309  BP: 132/66  Pulse: 71  Resp: 20  Temp: 97.8 F (36.6 C)  SpO2: 95%  Weight: 65.3 kg  Height: 5\' 2"  (1.575 m)    GENERAL: The patient is a well-nourished female, in no acute distress. The vital signs are documented above. CARDIOVASCULAR: Carotid arteries without bruits bilaterally.  2+ radial pulses bilaterally.  2+ femoral  pulses and 2+ dorsalis pedis pulses bilaterally PULMONARY: There is good air exchange  ABDOMEN: Soft and non-tender  MUSCULOSKELETAL: There are no major  deformities or cyanosis. NEUROLOGIC: No focal weakness or paresthesias are detected. SKIN: There are no ulcers or rashes noted. PSYCHIATRIC: The patient has a normal affect.  DATA:  MRI reviewed showing normal anatomy and location of aortic bifurcation  MEDICAL ISSUES: Had long discussion with the patient regarding the role for exposure.  Explained that Dr. Rolena Infante feels that she can have improvement in her back symptoms with surgery and that anterior approach is the best for fusion.  I explained that the low transverse incision in the left lower quadrant.  Explained mobilization of the rectus muscle and retroperitoneal dissection.  Explained mobilization of the left ureter and arterial venous structures overlying the spine.  Discussed potential complications specifically I discussed major venous injury possibility.  Patient understands and wished to proceed as scheduled   Rosetta Posner, MD [facility] Vascular and Vein Specialists of [facility] Tel [phone] Pager [phone]

## 2019-02-11 ENCOUNTER — Ambulatory Visit: Payer: Self-pay | Admitting: Orthopedic Surgery

## 2019-02-16 ENCOUNTER — Ambulatory Visit: Payer: Self-pay | Admitting: Orthopedic Surgery

## 2019-02-16 NOTE — H&P (Signed)
Subjective:   Kim Edwards is a pleasant 63 year old female who has had low back pain since 2013 which has significantly increased over the past few months and is now including numbness and tingling down her posterior right leg into her foot with associated right thigh weakness which has been ongoing since prior to her first visit with me on 11/10/2018. She is now also complaining of some left leg pain. Despite conservative measurement including OTC medications, narcotic medications and time the patient continues to have severe, debilitating pain that is effecting her quality of life. She is scheduled for ALIF L4-5 Right Far lateral Discectomy L3-4 on 02/26/19 at [facility] with Dr. Rolena Infante.  Patient Active Problem List   Diagnosis Date Noted  . Vitamin D deficiency 10/09/2017  . Screening for malignant neoplasm of cervix 08/24/2013  . Physical exam 08/24/2013  . Right lateral epicondylitis 08/24/2013  . Ulnar nerve compression 02/22/2012  . Lumbar radiculopathy 02/22/2012  . Abnormal LFTs 07/26/2010  . SHOULDER, PAIN 06/22/2010  . Hyperlipidemia 05/24/2010  . Anxiety and depression 05/24/2010  . PERIMENOPAUSAL SYNDROME 05/24/2010  . CERVICAL CANCER, HX OF 05/24/2010   Past Medical History:  Diagnosis Date  . Chronic back pain   . Degeneration of lumbar intervertebral disc 11/06/2017  . 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 History of cervical cancer 1988  . Hyperlipidemia   . Joint pain   . Numbness and tingling of right leg     Past Surgical History:  Procedure Laterality Date  . EYE SURGERY     age 35  . HAND SURGERY     polypectomy  . LASIK  04/24/2015    Current Outpatient Medications  Medication Sig Dispense Refill Last Dose  . cyclobenzaprine (FLEXERIL) 10 MG tablet Take 1 tablet (10 mg total) by mouth 2 (two) times daily as needed for muscle spasms. (Patient not taking: Reported on 02/10/2019) 14 tablet 0 Not Taking  . fenofibrate 160 MG  tablet TAKE ONE TABLET BY MOUTH DAILY 90 tablet 0 Taking  . gabapentin (NEURONTIN) 300 MG capsule gabapentin 300 mg capsule  Take 1 capsule every day by oral route at bedtime for 30 days.   Taking  . HYDROcodone-acetaminophen (NORCO/VICODIN) 5-325 MG tablet    Taking  . predniSONE (DELTASONE) 50 MG tablet Take 1 tablet (50 mg total) by mouth daily. (Patient not taking: Reported on 02/10/2019) 5 tablet 0 Not Taking  . rosuvastatin (CRESTOR) 20 MG tablet TAKE ONE TABLET BY MOUTH DAILY 90 tablet 0 Taking  . traMADol (ULTRAM) 50 MG tablet Take 1 tablet (50 mg total) by mouth every 6 (six) hours as needed. (Patient not taking: Reported on 02/10/2019) 15 tablet 0 Not Taking  . traZODone (DESYREL) 50 MG tablet Take 0.5-1 tablets (25-50 mg total) by mouth at bedtime as needed for sleep. 30 tablet 3 Taking   No current facility-administered medications for this visit.    Allergies  Allergen Reactions  . Buspar [Buspirone Hcl]     Joints hurt   . Oxycodone-Acetaminophen     REACTION: upset stomach    Social History   Tobacco Use  . Smoking status: Former Smoker    Quit date: 04/23/2000    Years since quitting: 18.8  . Smokeless tobacco: Never Used  Substance Use Topics  . Alcohol use: Yes    Alcohol/week: 0.0 standard drinks    Comment: 1-1.5 glasses of wine daily    Family History  Problem Relation Age of Onset  .  Heart disease Mother        quad bypass  . Diabetes Mother   . Heart attack Mother   . Heart disease Father        bypass x2  . Diabetes Father   . Heart attack Father   . Hypertension Father   . Colon cancer Neg Hx     Review of Systems As stated in HPI  Objective:   General: AAOX3, well developed and well nourished, NAD Ambulation: normal gait pattern, uses no assistive device. Inspection: No obvious deformity, scoliosis, kyphosis, loss of lordotic curve.  Heart: RRR, no rubs, murmers, gallops Lungs: CTAB Abdomen: BsX4, non-distended, non-tender Palpation:  Non-tender over spinous processes and paraspinal muscles. Tender to palpation over the right SI joint and buttock musculature.  AROM: -Hip IR/ER: LeftPain free, right Pain free - Knee: flexion and extension normal and pain free bilaterally. - Ankle: Dorsiflexion, plantarflexion, inversion, eversion normal and pain free.   Dermatomes: Lower extremity sensation to light touch abnormal positive dysesthesia posterior right leg  Myotomes:  - Hip Flexion: Left 4+/5, Right 5/5 - Knee Extension: Left 4+/5, Right 5/5 - Knee Flexion: Left 5/5, Right 5/5 - Ankle Dorsiflextion: Left 5/5, Right 5/5 - Ankle Eversion: Left 5/5, Right 5/5 - Ankle Plantarflexion: Left 5/5, Right 5/5  Reflexes:  - Patella: Left1+, Right unable to elicit on exam today - Achilles: Left1+, Right 1+ - Babinski: Left Ngative, Right Negative - Clonus: Negative  Special Tests: - Straight Leg Raise: Left Negative, Right Negative - FABER: Left: Negative, Right Negative  PV: Extremities warm and well profused. Posterior and dorsalis pedis pulse 2+ bilaterally, No pitting Edema, discoloration, calf tenderness, or palpable cords. Homan's negative bilaterally.   X-Ray impression: Multiple levels of mild to moderate degenerative disc disease. No significant scoliosis. There is a mild slip of L4 on L5. No signs of compression fracture.  Lumbar MRI: completed on 11/19/18 was reviewed with the patient. I have also reviewed the radiology report. New right foraminal disc herniation at L3-4 producing significant foraminal stenosis and compression of the right L3 nerve root. This is progressed since her prior MRI. L4-5 disease has also progressed on the left but is still more pronounced on the right. She has the spondylolisthesis with severe facet arthrosis and disc space degeneration. There is also significant foraminal stenosis as well and L4-5.   Assessment:   Kim Edwards is a very pleasant 63 year old woman with severe back buttock and  right neuropathic leg pain. I believe the new increasing right thigh pain is due to the L3 nerve compression secondary to the disc herniation. She is now also complaining of left leg pain as well which is likely compensatory and related to the spinal stenosis and spondylolisthesis at L4-5. She has right quad weakness on exam. Imaging studies show L3-4 disc herniation on the right as well as spinal stenosis and spondylolisthesis at L4-5, The patient has expressed a desire to move forward with surgery to address the new L3-4 disc herniation and the chronic spinal stenosis and spondylolisthesis at L4-5.   Plan:   L4-5 ALIF, L3-4 right foraminal discectomy  ALIF: Risks and benefits of surgery were discussed with the patient. These include: Infection, bleeding, death, stroke, paralysis, ongoing or worse pain, need for additional surgery, nonunion, leak of spinal fluid, adjacent segment degeneration requiring additional fusion surgery, need for posterior decompression and/or fusion. Bleeding from major vessels, and blood clots (deep venous thrombosis)requiring additional treatment. Due to the abdominal contents requiring further intervention, loss  in bowel and bladder control. Additional risk for female patients: Retrograde ejaculation, and therefore infertility. Lateral discectomy: Risks and benefits of surgery were discussed with the patient. These include: Infection, bleeding, death, stroke, paralysis, ongoing or worse pain, need for additional surgery, leak of spinal fluid, adjacent segment degeneration requiring additional surgery, post-operative hematoma formation that can result in neurological compromise and the need for urgent/emergent re-operation. Loss in bowel and bladder control. Injury to major vessels that could result in the need for urgent abdominal surgery to stop bleeding. Risk of deep venous thrombosis (DVT) and the need for additional treatment. Recurrent disc herniation resulting in the need  for revision surgery, which could include fusion surgery (utilizing instrumentation such as pedicle screws and intervertebral cages).  Additional risk: If instrumentation is used there is a risk of migration, or breakage of that hardware that could require additional surgery.  We have reviewed the patient's medications, and I have instructed her to stop NSAIDs 1 week prior to surgery as well as discontinue the use for at least 2 weeks postoperatively. She did express understanding of this. She is not on any blood thinners or aspirin.  We have obtained preoperative clearance from the patient's primary care provider. Patient has been evaluated by the vascular surgeon.  Patient is being fitted after this appointment for her LSO brace.  We have also discussed the goals of surgery to include: Goals of surgery: Reduction in pain, and improvement in quality of life.  We have also discussed the post-operative recovery period to include: bathing/showering restrictions, wound healing, activity (and driving) restrictions, medications/pain mangement.  We have also discussed post-operative redflags to include: signs and symptoms of postoperative infection, DVT/PE.  At the patient's request, I have refilled her gabapentin and oxycodone/acetaminophen.  Follow-up: 2 weeks after surgery.

## 2019-02-20 NOTE — Pre-Procedure Instructions (Signed)
Kim Edwards  02/20/2019      [facility] [place], [address] [place] [address] Phone: [phone] Fax: [phone]    Your procedure is scheduled on 02/26/19.  Report to [facility] Admitting at 530 A.M.  Call this number if you have problems the morning of surgery:  [phone]   Remember:  Do not eat or drink after midnight.     Take these medicines the morning of surgery with A SIP OF WATER ----Chinita Pester    Do not wear jewelry, make-up or nail polish.  Do not wear lotions, powders, or perfumes, or deodorant.  Do not shave 48 hours prior to surgery.  Men may shave face and neck.  Do not bring valuables to the hospital.  [facility] is not responsible for any belongings or valuables.  Contacts, dentures or bridgework may not be worn into surgery.  Leave your suitcase in the car.  After surgery it may be brought to your room.  For patients admitted to the hospital, discharge time will be determined by your treatment team.  Patients discharged the day of surgery will not be allowed to drive home.      Do not take any aspirin,anti-inflammatories,vitamins,or herbal supplements 5-7 days prior to surgery.   Please read over the following fact sheets that you were given. MRSA Information [place] - Preparing for Surgery  Before surgery, you can play an important role.  Because skin is not sterile, your skin needs to be as free of germs as possible.  You can reduce the number of germs on you skin by washing with CHG (chlorahexidine gluconate) soap before surgery.  CHG is an antiseptic cleaner which kills germs and bonds with the skin to continue killing germs even after washing.  Oral Hygiene is also important in reducing the risk of infection.  Remember to brush your teeth with your regular toothpaste the morning of surgery.  Please DO NOT use if you have an allergy to  CHG or antibacterial soaps.  If your skin becomes reddened/irritated stop using the CHG and inform your nurse when you arrive at Short Stay.  Do not shave (including legs and underarms) for at least 48 hours prior to the first CHG shower.  You may shave your face.  Please follow these instructions carefully:   1.  Shower with CHG Soap the night before surgery and the morning of Surgery.  2.  If you choose to wash your hair, wash your hair first as usual with your normal shampoo.  3.  After you shampoo, rinse your hair and body thoroughly to remove the shampoo. 4.  Use CHG as you would any other liquid soap.  You can apply chg directly to the skin and wash gently with a      scrungie or washcloth.           5.  Apply the CHG Soap to your body ONLY FROM THE NECK DOWN.   Do not use on open wounds or open sores. Avoid contact with your eyes, ears, mouth and genitals (private parts).  Wash genitals (private parts) with your normal soap.  6.  Wash thoroughly, paying special attention to the area where your surgery will be performed.  7.  Thoroughly rinse your body with warm water from the neck down.  8.  DO NOT shower/wash with your normal soap after using and  rinsing off the CHG Soap.  9.  Pat yourself dry with a clean towel.            10.  Wear clean pajamas.            11.  Place clean sheets on your bed the night of your first shower and do not sleep with pets.  Day of Surgery  Do not apply any lotions/deoderants the morning of surgery.   Please wear clean clothes to the hospital/surgery center. Remember to brush your teeth with toothpaste.

## 2019-02-23 ENCOUNTER — Ambulatory Visit (HOSPITAL_COMMUNITY)
Admission: RE | Admit: 2019-02-23 | Discharge: 2019-02-23 | Disposition: A | Discharge status: Home / Self Care | Payer: Managed Care, Other (non HMO) | Source: Ambulatory Visit | Attending: Orthopedic Surgery | Admitting: Orthopedic Surgery

## 2019-02-23 ENCOUNTER — Other Ambulatory Visit: Payer: Self-pay

## 2019-02-23 ENCOUNTER — Encounter (HOSPITAL_COMMUNITY): Payer: Self-pay

## 2019-02-23 ENCOUNTER — Other Ambulatory Visit (HOSPITAL_COMMUNITY)
Admission: RE | Admit: 2019-02-23 | Discharge: 2019-02-23 | Disposition: A | Discharge status: Home / Self Care | Payer: Managed Care, Other (non HMO) | Source: Ambulatory Visit | Attending: Orthopedic Surgery | Admitting: Orthopedic Surgery

## 2019-02-23 ENCOUNTER — Encounter (HOSPITAL_COMMUNITY)
Admission: RE | Admit: 2019-02-23 | Discharge: 2019-02-23 | Disposition: A | Discharge status: Home / Self Care | Payer: Managed Care, Other (non HMO) | Source: Ambulatory Visit | Attending: Orthopedic Surgery | Admitting: Orthopedic Surgery

## 2019-02-23 DIAGNOSIS — Z01818 Encounter for other preprocedural examination: Secondary | ICD-10-CM

## 2019-02-23 LAB — URINALYSIS, ROUTINE W REFLEX MICROSCOPIC
Bilirubin Urine: NEGATIVE
Glucose, UA: NEGATIVE mg/dL
Hgb urine dipstick: NEGATIVE
Ketones, ur: NEGATIVE mg/dL
Nitrite: NEGATIVE
Protein, ur: NEGATIVE mg/dL
Specific Gravity, Urine: 1.021 (ref 1.005–1.030)
pH: 5 (ref 5.0–8.0)

## 2019-02-23 LAB — BASIC METABOLIC PANEL
Anion gap: 15 (ref 5–15)
BUN: 16 mg/dL (ref 8–23)
CO2: 24 mmol/L (ref 22–32)
Calcium: 8.9 mg/dL (ref 8.9–10.3)
Chloride: 99 mmol/L (ref 98–111)
Creatinine, Ser: 0.83 mg/dL (ref 0.44–1.00)
GFR calc Af Amer: >60 mL/min (ref >60)
GFR calc non Af Amer: >60 mL/min (ref >60)
Glucose, Bld: 127 mg/dL — ABNORMAL HIGH (ref 70–99)
Potassium: 3.8 mmol/L (ref 3.5–5.1)
Sodium: 138 mmol/L (ref 135–145)

## 2019-02-23 LAB — CBC
HCT: 41.4 % (ref 36.0–46.0)
Hemoglobin: 13.6 g/dL (ref 12.0–15.0)
MCH: 31.9 pg (ref 26.0–34.0)
MCHC: 32.9 g/dL (ref 30.0–36.0)
MCV: 97.2 fL (ref 80.0–100.0)
Platelets: 270 10*3/uL (ref 150–400)
RBC: 4.26 MIL/uL (ref 3.87–5.11)
RDW: 12.4 % (ref 11.5–15.5)
WBC: 7.9 10*3/uL (ref 4.0–10.5)
nRBC: 0 % (ref 0.0–0.2)

## 2019-02-23 LAB — TYPE AND SCREEN
ABO/RH(D): B POS
Antibody Screen: NEGATIVE

## 2019-02-23 LAB — ABO/RH: ABO/RH(D): B POS

## 2019-02-23 LAB — PROTIME-INR
INR: 1.1 (ref 0.8–1.2)
Prothrombin Time: 13.8 seconds (ref 11.4–15.2)

## 2019-02-23 LAB — SURGICAL PCR SCREEN
MRSA, PCR: NEGATIVE
Staphylococcus aureus: NEGATIVE

## 2019-02-23 LAB — APTT: aPTT: 25 seconds (ref 24–36)

## 2019-02-23 NOTE — Progress Notes (Signed)
PCP - DR Milagros Reap Cardiologist - NA        Chest x-ray - TODAY EKG - 9/20 Stress Test - NA ECHO -NA  Cardiac Cath - NA      Aspirin Instructions:STOP    COVID TEST- TODAY  Anesthesia review: NA  Patient denies shortness of breath, fever, cough and chest pain at PAT appointment   All instructions explained to the patient, with a verbal understanding of the material. Patient agrees to go over the instructions while at home for a better understanding. Patient also instructed to self quarantine after being tested for COVID-19. The opportunity to ask questions was provided.

## 2019-02-24 LAB — NOVEL CORONAVIRUS, NAA (HOSP ORDER, SEND-OUT TO REF LAB; TAT 18-24 HRS): SARS-CoV-2, NAA: NOT DETECTED

## 2019-02-25 NOTE — Anesthesia Preprocedure Evaluation (Addendum)
Anesthesia Evaluation  Patient identified by MRN, date of birth, ID band Patient awake    Reviewed: Allergy & Precautions, NPO status , Patient's Chart, lab work & pertinent test results  History of Anesthesia Complications Negative for: history of anesthetic complications  Airway Mallampati: II  TM Distance: >3 FB Neck ROM: Full    Dental no notable dental hx.    Pulmonary former smoker,    Pulmonary exam normal        Cardiovascular negative cardio ROS Normal cardiovascular exam     Neuro/Psych Anxiety Depression Lumbar radiculopathy    GI/Hepatic negative GI ROS, Neg liver ROS,   Endo/Other  negative endocrine ROS  Renal/GU negative Renal ROS  negative genitourinary   Musculoskeletal  (+) Arthritis ,   Abdominal   Peds  Hematology negative hematology ROS (+)   Anesthesia Other Findings Day of surgery medications reviewed with patient.  Reproductive/Obstetrics negative OB ROS                            Anesthesia Physical Anesthesia Plan  ASA: II  Anesthesia Plan: General   Post-op Pain Management: GA combined w/ Regional for post-op pain   Induction: Intravenous  PONV Risk Score and Plan: 4 or greater and Treatment may vary due to age or medical condition, Ondansetron, Dexamethasone and Midazolam  Airway Management Planned: Oral ETT  Additional Equipment: Arterial line  Intra-op Plan:   Post-operative Plan: Extubation in OR  Informed Consent: I have reviewed the patients History and Physical, chart, labs and discussed the procedure including the risks, benefits and alternatives for the proposed anesthesia with the patient or authorized representative who has indicated his/her understanding and acceptance.     Dental advisory given  Plan Discussed with: CRNA  Anesthesia Plan Comments:        Anesthesia Quick Evaluation

## 2019-02-26 ENCOUNTER — Encounter (HOSPITAL_COMMUNITY): Payer: Self-pay

## 2019-02-26 ENCOUNTER — Inpatient Hospital Stay (HOSPITAL_COMMUNITY): Payer: Managed Care, Other (non HMO)

## 2019-02-26 ENCOUNTER — Inpatient Hospital Stay (HOSPITAL_COMMUNITY)
Admission: RE | Disposition: A | Discharge status: Home / Self Care | Payer: Self-pay | Source: Home / Self Care | Attending: Orthopedic Surgery

## 2019-02-26 ENCOUNTER — Other Ambulatory Visit: Payer: Self-pay

## 2019-02-26 ENCOUNTER — Inpatient Hospital Stay (HOSPITAL_COMMUNITY)
Admission: RE | Admit: 2019-02-26 | Discharge: 2019-02-27 | DRG: 460 | Disposition: A | Discharge status: Home / Self Care | Payer: Managed Care, Other (non HMO) | Attending: Orthopedic Surgery | Admitting: Orthopedic Surgery

## 2019-02-26 ENCOUNTER — Inpatient Hospital Stay (HOSPITAL_COMMUNITY): Payer: Managed Care, Other (non HMO) | Admitting: Certified Registered"

## 2019-02-26 ENCOUNTER — Inpatient Hospital Stay (HOSPITAL_COMMUNITY): Payer: Managed Care, Other (non HMO) | Admitting: Vascular Surgery

## 2019-02-26 DIAGNOSIS — M5116 Intervertebral disc disorders with radiculopathy, lumbar region: Secondary | ICD-10-CM | POA: Diagnosis present

## 2019-02-26 DIAGNOSIS — M5416 Radiculopathy, lumbar region: Secondary | ICD-10-CM | POA: Diagnosis not present

## 2019-02-26 DIAGNOSIS — Z888 Allergy status to other drugs, medicaments and biological substances status: Secondary | ICD-10-CM

## 2019-02-26 DIAGNOSIS — Z8541 Personal history of malignant neoplasm of cervix uteri: Secondary | ICD-10-CM

## 2019-02-26 DIAGNOSIS — Z981 Arthrodesis status: Secondary | ICD-10-CM

## 2019-02-26 DIAGNOSIS — Z419 Encounter for procedure for purposes other than remedying health state, unspecified: Secondary | ICD-10-CM

## 2019-02-26 DIAGNOSIS — M48062 Spinal stenosis, lumbar region with neurogenic claudication: Principal | ICD-10-CM | POA: Diagnosis present

## 2019-02-26 DIAGNOSIS — E785 Hyperlipidemia, unspecified: Secondary | ICD-10-CM | POA: Diagnosis present

## 2019-02-26 DIAGNOSIS — Z87891 Personal history of nicotine dependence: Secondary | ICD-10-CM | POA: Diagnosis not present

## 2019-02-26 DIAGNOSIS — Z885 Allergy status to narcotic agent status: Secondary | ICD-10-CM

## 2019-02-26 DIAGNOSIS — M4316 Spondylolisthesis, lumbar region: Secondary | ICD-10-CM | POA: Diagnosis present

## 2019-02-26 DIAGNOSIS — Z20828 Contact with and (suspected) exposure to other viral communicable diseases: Secondary | ICD-10-CM | POA: Diagnosis present

## 2019-02-26 DIAGNOSIS — E559 Vitamin D deficiency, unspecified: Secondary | ICD-10-CM | POA: Diagnosis present

## 2019-02-26 HISTORY — PX: LUMBAR LAMINECTOMY/DECOMPRESSION MICRODISCECTOMY: SHX5026

## 2019-02-26 HISTORY — PX: ANTERIOR LUMBAR FUSION: SHX1170

## 2019-02-26 HISTORY — PX: ABDOMINAL EXPOSURE: SHX5708

## 2019-02-26 LAB — CBC
HCT: 35.1 % — ABNORMAL LOW (ref 36.0–46.0)
Hemoglobin: 11.9 g/dL — ABNORMAL LOW (ref 12.0–15.0)
MCH: 32.4 pg (ref 26.0–34.0)
MCHC: 33.9 g/dL (ref 30.0–36.0)
MCV: 95.6 fL (ref 80.0–100.0)
Platelets: 194 10*3/uL (ref 150–400)
RBC: 3.67 MIL/uL — ABNORMAL LOW (ref 3.87–5.11)
RDW: 12.7 % (ref 11.5–15.5)
WBC: 9 10*3/uL (ref 4.0–10.5)
nRBC: 0 % (ref 0.0–0.2)

## 2019-02-26 LAB — CREATININE, SERUM
Creatinine, Ser: 1.07 mg/dL — ABNORMAL HIGH (ref 0.44–1.00)
GFR calc Af Amer: >60 mL/min (ref >60)
GFR calc non Af Amer: 56 mL/min — ABNORMAL LOW (ref >60)

## 2019-02-26 SURGERY — ANTERIOR LUMBAR FUSION 1 LEVEL
Anesthesia: General | Laterality: Right

## 2019-02-26 MED ORDER — FENTANYL CITRATE (PF) 100 MCG/2ML IJ SOLN
INTRAMUSCULAR | Status: DC | PRN
  Administered 2019-02-26: 100 ug via INTRAVENOUS
  Administered 2019-02-26: 50 ug via INTRAVENOUS
  Administered 2019-02-26: 100 ug via INTRAVENOUS
  Administered 2019-02-26 (×2): 50 ug via INTRAVENOUS
  Administered 2019-02-26: 100 ug via INTRAVENOUS
  Administered 2019-02-26 (×4): 50 ug via INTRAVENOUS

## 2019-02-26 MED ORDER — ONDANSETRON HCL 4 MG/2ML IJ SOLN: 4.0000 mg | Freq: Four times a day (QID) | INTRAMUSCULAR | Status: DC | PRN

## 2019-02-26 MED ORDER — METHOCARBAMOL 500 MG PO TABS: 500.0000 mg | ORAL_TABLET | Freq: Three times a day (TID) | ORAL | 0 refills | Status: AC | PRN

## 2019-02-26 MED ORDER — ACETAMINOPHEN 325 MG PO TABS: 650.0000 mg | ORAL_TABLET | ORAL | Status: DC | PRN

## 2019-02-26 MED ORDER — DOCUSATE SODIUM 100 MG PO CAPS
100.0000 mg | ORAL_CAPSULE | Freq: Two times a day (BID) | ORAL | Status: DC
  Administered 2019-02-26 – 2019-02-27 (×3): 100 mg via ORAL
  Filled 2019-02-26 (×2): qty 1

## 2019-02-26 MED ORDER — SODIUM CHLORIDE 0.9% FLUSH: 3.0000 mL | INTRAVENOUS | Status: DC | PRN

## 2019-02-26 MED ORDER — ROCURONIUM BROMIDE 10 MG/ML (PF) SYRINGE
PREFILLED_SYRINGE | INTRAVENOUS | Status: AC
  Filled 2019-02-26: qty 10

## 2019-02-26 MED ORDER — OXYCODONE HCL 5 MG PO TABS
10.0000 mg | ORAL_TABLET | ORAL | Status: DC | PRN
  Administered 2019-02-26 – 2019-02-27 (×5): 10 mg via ORAL
  Filled 2019-02-26 (×5): qty 2

## 2019-02-26 MED ORDER — ROCURONIUM BROMIDE 50 MG/5ML IV SOSY
PREFILLED_SYRINGE | INTRAVENOUS | Status: DC | PRN
  Administered 2019-02-26 (×3): 50 mg via INTRAVENOUS

## 2019-02-26 MED ORDER — HEMOSTATIC AGENTS (NO CHARGE) OPTIME
TOPICAL | Status: DC | PRN
  Administered 2019-02-26 (×2): 1 via TOPICAL

## 2019-02-26 MED ORDER — METHOCARBAMOL 1000 MG/10ML IJ SOLN
500.0000 mg | Freq: Four times a day (QID) | INTRAVENOUS | Status: DC | PRN
  Filled 2019-02-26: qty 5

## 2019-02-26 MED ORDER — MORPHINE SULFATE (PF) 2 MG/ML IV SOLN: 1.0000 mg | INTRAVENOUS | Status: AC | PRN

## 2019-02-26 MED ORDER — PHENYLEPHRINE 40 MCG/ML (10ML) SYRINGE FOR IV PUSH (FOR BLOOD PRESSURE SUPPORT)
PREFILLED_SYRINGE | INTRAVENOUS | Status: DC | PRN
  Administered 2019-02-26 (×3): 40 ug via INTRAVENOUS

## 2019-02-26 MED ORDER — BUPIVACAINE LIPOSOME 1.3 % IJ SUSP
INTRAMUSCULAR | Status: DC | PRN
  Administered 2019-02-26: 10 mL via PERINEURAL

## 2019-02-26 MED ORDER — 0.9 % SODIUM CHLORIDE (POUR BTL) OPTIME
TOPICAL | Status: DC | PRN
  Administered 2019-02-26: 1000 mL

## 2019-02-26 MED ORDER — ACETAMINOPHEN 650 MG RE SUPP: 650.0000 mg | RECTAL | Status: DC | PRN

## 2019-02-26 MED ORDER — OXYCODONE HCL 5 MG PO TABS: 5.0000 mg | ORAL_TABLET | ORAL | Status: DC | PRN

## 2019-02-26 MED ORDER — FENTANYL CITRATE (PF) 250 MCG/5ML IJ SOLN
INTRAMUSCULAR | Status: AC
  Filled 2019-02-26: qty 10

## 2019-02-26 MED ORDER — DEXAMETHASONE SODIUM PHOSPHATE 10 MG/ML IJ SOLN
INTRAMUSCULAR | Status: AC
  Filled 2019-02-26: qty 1

## 2019-02-26 MED ORDER — METHOCARBAMOL 500 MG PO TABS
ORAL_TABLET | ORAL | Status: AC
  Filled 2019-02-26: qty 1

## 2019-02-26 MED ORDER — MENTHOL 3 MG MT LOZG: 1.0000 | LOZENGE | OROMUCOSAL | Status: DC | PRN

## 2019-02-26 MED ORDER — CHLORHEXIDINE GLUCONATE 4 % EX LIQD: 60.0000 mL | Freq: Once | CUTANEOUS | Status: DC

## 2019-02-26 MED ORDER — PHENYLEPHRINE HCL-NACL 10-0.9 MG/250ML-% IV SOLN
INTRAVENOUS | Status: DC | PRN
  Administered 2019-02-26: 20 ug/min via INTRAVENOUS

## 2019-02-26 MED ORDER — FENTANYL CITRATE (PF) 250 MCG/5ML IJ SOLN
INTRAMUSCULAR | Status: AC
  Filled 2019-02-26: qty 5

## 2019-02-26 MED ORDER — PROPOFOL 10 MG/ML IV BOLUS
INTRAVENOUS | Status: AC
  Filled 2019-02-26: qty 20

## 2019-02-26 MED ORDER — HYDROMORPHONE HCL 1 MG/ML IJ SOLN
INTRAMUSCULAR | Status: AC
  Filled 2019-02-26: qty 1

## 2019-02-26 MED ORDER — BUPIVACAINE-EPINEPHRINE (PF) 0.5% -1:200000 IJ SOLN
INTRAMUSCULAR | Status: DC | PRN
  Administered 2019-02-26: 20 mL

## 2019-02-26 MED ORDER — MAGNESIUM CITRATE PO SOLN
1.0000 | Freq: Once | ORAL | Status: AC | PRN
  Administered 2019-02-26: 1 via ORAL
  Filled 2019-02-26: qty 296

## 2019-02-26 MED ORDER — MIDAZOLAM HCL 2 MG/2ML IJ SOLN
INTRAMUSCULAR | Status: AC
  Filled 2019-02-26: qty 2

## 2019-02-26 MED ORDER — LACTATED RINGERS IV SOLN
INTRAVENOUS | Status: DC | PRN
  Administered 2019-02-26 (×3): via INTRAVENOUS

## 2019-02-26 MED ORDER — ENOXAPARIN SODIUM 40 MG/0.4ML ~~LOC~~ SOLN: 40.0000 mg | SUBCUTANEOUS | 0 refills | Status: DC

## 2019-02-26 MED ORDER — ONDANSETRON HCL 4 MG PO TABS: 4.0000 mg | ORAL_TABLET | Freq: Three times a day (TID) | ORAL | 0 refills | Status: DC | PRN

## 2019-02-26 MED ORDER — METHYLPREDNISOLONE ACETATE 40 MG/ML IJ SUSP
INTRAMUSCULAR | Status: DC | PRN
  Administered 2019-02-26: 1 mL

## 2019-02-26 MED ORDER — PHENOL 1.4 % MT LIQD: 1.0000 | OROMUCOSAL | Status: DC | PRN

## 2019-02-26 MED ORDER — THROMBIN 20000 UNITS EX SOLR
CUTANEOUS | Status: DC | PRN
  Administered 2019-02-26: 20000 [IU] via TOPICAL

## 2019-02-26 MED ORDER — LIDOCAINE 2% (20 MG/ML) 5 ML SYRINGE
INTRAMUSCULAR | Status: AC
  Filled 2019-02-26: qty 5

## 2019-02-26 MED ORDER — SUGAMMADEX SODIUM 200 MG/2ML IV SOLN
INTRAVENOUS | Status: DC | PRN
  Administered 2019-02-26: 140 mg via INTRAVENOUS

## 2019-02-26 MED ORDER — GABAPENTIN 300 MG PO CAPS
300.0000 mg | ORAL_CAPSULE | Freq: Three times a day (TID) | ORAL | Status: DC
  Administered 2019-02-26 – 2019-02-27 (×3): 300 mg via ORAL
  Filled 2019-02-26 (×3): qty 1

## 2019-02-26 MED ORDER — HYDROMORPHONE HCL 1 MG/ML IJ SOLN
0.2500 mg | INTRAMUSCULAR | Status: DC | PRN
  Administered 2019-02-26 (×2): 0.5 mg via INTRAVENOUS

## 2019-02-26 MED ORDER — CEFAZOLIN SODIUM-DEXTROSE 1-4 GM/50ML-% IV SOLN
1.0000 g | Freq: Three times a day (TID) | INTRAVENOUS | Status: AC
  Administered 2019-02-26 – 2019-02-27 (×2): 1 g via INTRAVENOUS
  Filled 2019-02-26 (×2): qty 50

## 2019-02-26 MED ORDER — BUPIVACAINE-EPINEPHRINE (PF) 0.5% -1:200000 IJ SOLN
INTRAMUSCULAR | Status: DC | PRN
  Administered 2019-02-26: 20 mL via PERINEURAL

## 2019-02-26 MED ORDER — ROSUVASTATIN CALCIUM 20 MG PO TABS
20.0000 mg | ORAL_TABLET | Freq: Every day | ORAL | Status: DC
  Administered 2019-02-26 – 2019-02-27 (×2): 20 mg via ORAL
  Filled 2019-02-26 (×2): qty 1

## 2019-02-26 MED ORDER — ACETAMINOPHEN 10 MG/ML IV SOLN: 1000.0000 mg | Freq: Once | INTRAVENOUS | Status: DC | PRN

## 2019-02-26 MED ORDER — SODIUM CHLORIDE 0.9 % IV SOLN: 250.0000 mL | INTRAVENOUS | Status: DC

## 2019-02-26 MED ORDER — PROMETHAZINE HCL 25 MG/ML IJ SOLN: 6.2500 mg | INTRAMUSCULAR | Status: DC | PRN

## 2019-02-26 MED ORDER — MIDAZOLAM HCL 5 MG/5ML IJ SOLN
INTRAMUSCULAR | Status: DC | PRN
  Administered 2019-02-26: 2 mg via INTRAVENOUS

## 2019-02-26 MED ORDER — POLYETHYLENE GLYCOL 3350 17 G PO PACK: 17.0000 g | PACK | Freq: Every day | ORAL | Status: DC | PRN

## 2019-02-26 MED ORDER — METHOCARBAMOL 500 MG PO TABS
500.0000 mg | ORAL_TABLET | Freq: Four times a day (QID) | ORAL | Status: DC | PRN
  Administered 2019-02-26 – 2019-02-27 (×3): 500 mg via ORAL
  Filled 2019-02-26 (×3): qty 1

## 2019-02-26 MED ORDER — ENOXAPARIN SODIUM 40 MG/0.4ML ~~LOC~~ SOLN
40.0000 mg | SUBCUTANEOUS | Status: DC
  Administered 2019-02-27: 40 mg via SUBCUTANEOUS
  Filled 2019-02-26: qty 0.4

## 2019-02-26 MED ORDER — HYDROCODONE-ACETAMINOPHEN 10-325 MG PO TABS: 1.0000 | ORAL_TABLET | Freq: Four times a day (QID) | ORAL | 0 refills | Status: AC | PRN

## 2019-02-26 MED ORDER — METHYLPREDNISOLONE ACETATE 40 MG/ML IJ SUSP
INTRAMUSCULAR | Status: AC
  Filled 2019-02-26: qty 1

## 2019-02-26 MED ORDER — BUPIVACAINE-EPINEPHRINE 0.5% -1:200000 IJ SOLN
INTRAMUSCULAR | Status: AC
  Filled 2019-02-26: qty 1

## 2019-02-26 MED ORDER — PROPOFOL 10 MG/ML IV BOLUS
INTRAVENOUS | Status: DC | PRN
  Administered 2019-02-26: 120 mg via INTRAVENOUS

## 2019-02-26 MED ORDER — LACTATED RINGERS IV SOLN: INTRAVENOUS | Status: DC

## 2019-02-26 MED ORDER — THROMBIN (RECOMBINANT) 20000 UNITS EX SOLR
CUTANEOUS | Status: AC
  Filled 2019-02-26: qty 20000

## 2019-02-26 MED ORDER — ONDANSETRON HCL 4 MG PO TABS: 4.0000 mg | ORAL_TABLET | Freq: Four times a day (QID) | ORAL | Status: DC | PRN

## 2019-02-26 MED ORDER — CEFAZOLIN SODIUM-DEXTROSE 2-4 GM/100ML-% IV SOLN
2.0000 g | INTRAVENOUS | Status: AC
  Administered 2019-02-26 (×2): 2 g via INTRAVENOUS
  Filled 2019-02-26: qty 100

## 2019-02-26 MED ORDER — LIDOCAINE 2% (20 MG/ML) 5 ML SYRINGE
INTRAMUSCULAR | Status: DC | PRN
  Administered 2019-02-26: 60 mg via INTRAVENOUS

## 2019-02-26 MED ORDER — DEXAMETHASONE SODIUM PHOSPHATE 10 MG/ML IJ SOLN
INTRAMUSCULAR | Status: DC | PRN
  Administered 2019-02-26: 10 mg via INTRAVENOUS

## 2019-02-26 MED ORDER — ONDANSETRON HCL 4 MG/2ML IJ SOLN
INTRAMUSCULAR | Status: AC
  Filled 2019-02-26: qty 2

## 2019-02-26 MED ORDER — SODIUM CHLORIDE 0.9% FLUSH
3.0000 mL | Freq: Two times a day (BID) | INTRAVENOUS | Status: DC
  Administered 2019-02-26: 3 mL via INTRAVENOUS

## 2019-02-26 SURGICAL SUPPLY — 96 items
APPLIER CLIP 11 MED OPEN (CLIP)
BLADE SURG 10 STRL SS (BLADE) ×3 IMPLANT
BNDG GAUZE ELAST 4 BULKY (GAUZE/BANDAGES/DRESSINGS) ×3 IMPLANT
BONE VIVIGEN FORMABLE 5.4CC (Bone Implant) ×3 IMPLANT
BUR EGG ELITE 4.0 (BURR) ×1 IMPLANT
CABLE BIPOLOR RESECTION CORD (MISCELLANEOUS) ×3 IMPLANT
CANISTER SUCT 3000ML PPV (MISCELLANEOUS) ×3 IMPLANT
CLIP APPLIE 11 MED OPEN (CLIP) ×4 IMPLANT
CLSR STERI-STRIP ANTIMIC 1/2X4 (GAUZE/BANDAGES/DRESSINGS) ×3 IMPLANT
COVER SURGICAL LIGHT HANDLE (MISCELLANEOUS) ×6 IMPLANT
DERMABOND ADVANCED (GAUZE/BANDAGES/DRESSINGS) ×1
DERMABOND ADVANCED .7 DNX12 (GAUZE/BANDAGES/DRESSINGS) ×2 IMPLANT
DRAPE C-ARM 42X72 X-RAY (DRAPES) ×6 IMPLANT
DRAPE C-ARMOR (DRAPES) ×3 IMPLANT
DRAPE POUCH INSTRU U-SHP 10X18 (DRAPES) ×3 IMPLANT
DRAPE SURG 17X23 STRL (DRAPES) ×12 IMPLANT
DRAPE U-SHAPE 47X51 STRL (DRAPES) ×6 IMPLANT
DRSG OPSITE POSTOP 3X4 (GAUZE/BANDAGES/DRESSINGS) ×1 IMPLANT
DRSG OPSITE POSTOP 4X6 (GAUZE/BANDAGES/DRESSINGS) ×3 IMPLANT
DRSG OPSITE POSTOP 4X8 (GAUZE/BANDAGES/DRESSINGS) ×3 IMPLANT
DURAPREP 26ML APPLICATOR (WOUND CARE) ×6 IMPLANT
ELECT BLADE 4.0 EZ CLEAN MEGAD (MISCELLANEOUS) ×3
ELECT CAUTERY BLADE 6.4 (BLADE) ×3 IMPLANT
ELECT PENCIL ROCKER SW 15FT (MISCELLANEOUS) ×6 IMPLANT
ELECT REM PT RETURN 9FT ADLT (ELECTROSURGICAL) ×6
ELECTRODE BLDE 4.0 EZ CLN MEGD (MISCELLANEOUS) ×6 IMPLANT
ELECTRODE REM PT RTRN 9FT ADLT (ELECTROSURGICAL) ×4 IMPLANT
GLOVE BIO SURGEON STRL SZ 6.5 (GLOVE) ×8 IMPLANT
GLOVE BIO SURGEON STRL SZ7.5 (GLOVE) IMPLANT
GLOVE BIOGEL PI IND STRL 6 (GLOVE) IMPLANT
GLOVE BIOGEL PI IND STRL 6.5 (GLOVE) ×4 IMPLANT
GLOVE BIOGEL PI IND STRL 7.0 (GLOVE) IMPLANT
GLOVE BIOGEL PI IND STRL 8.5 (GLOVE) ×6 IMPLANT
GLOVE BIOGEL PI INDICATOR 6 (GLOVE) ×1
GLOVE BIOGEL PI INDICATOR 6.5 (GLOVE) ×2
GLOVE BIOGEL PI INDICATOR 7.0 (GLOVE) ×2
GLOVE BIOGEL PI INDICATOR 8.5 (GLOVE) ×3
GLOVE SS BIOGEL STRL SZ 7.5 (GLOVE) ×2 IMPLANT
GLOVE SS BIOGEL STRL SZ 8.5 (GLOVE) ×6 IMPLANT
GLOVE SUPERSENSE BIOGEL SZ 7.5 (GLOVE) ×1
GLOVE SUPERSENSE BIOGEL SZ 8.5 (GLOVE) ×3
GOWN STRL REUS W/ TWL LRG LVL3 (GOWN DISPOSABLE) ×4 IMPLANT
GOWN STRL REUS W/ TWL XL LVL3 (GOWN DISPOSABLE) ×4 IMPLANT
GOWN STRL REUS W/TWL 2XL LVL3 (GOWN DISPOSABLE) ×10 IMPLANT
GOWN STRL REUS W/TWL LRG LVL3 (GOWN DISPOSABLE) ×2
GOWN STRL REUS W/TWL XL LVL3 (GOWN DISPOSABLE) ×2
GRAFT BNE MATRIX VG FRMBL MD 5 (Bone Implant) IMPLANT
HEMOSTAT SNOW SURGICEL 2X4 (HEMOSTASIS) IMPLANT
INTERPLATE 39X14X12 (Plate) ×3 IMPLANT
IV CATH 14GX2 1/4 (CATHETERS) IMPLANT
KIT BASIN OR (CUSTOM PROCEDURE TRAY) ×5 IMPLANT
KIT TURNOVER KIT B (KITS) ×5 IMPLANT
NDL SAFETY ECLIPSE 18X1.5 (NEEDLE) IMPLANT
NDL SPNL 18GX3.5 QUINCKE PK (NEEDLE) ×6 IMPLANT
NEEDLE 22X1 1/2 (OR ONLY) (NEEDLE) ×3 IMPLANT
NEEDLE HYPO 18GX1.5 SHARP (NEEDLE) ×1
NEEDLE SPNL 18GX3.5 QUINCKE PK (NEEDLE) ×6 IMPLANT
NS IRRIG 1000ML POUR BTL (IV SOLUTION) ×6 IMPLANT
PACK LAMINECTOMY ORTHO (CUSTOM PROCEDURE TRAY) ×6 IMPLANT
PACK UNIVERSAL I (CUSTOM PROCEDURE TRAY) ×6 IMPLANT
PAD ARMBOARD 7.5X6 YLW CONV (MISCELLANEOUS) ×18 IMPLANT
PATTIES SURGICAL .5 X.5 (GAUZE/BANDAGES/DRESSINGS) IMPLANT
PATTIES SURGICAL .5 X1 (DISPOSABLE) ×3 IMPLANT
PEEK SPACER INTERPLAT 35X14X12 (Peek) ×1 IMPLANT
PLATE SPINAL INTERPLT 39X14X12 (Plate) IMPLANT
SCREW BONE RESCUE (Screw) ×1 IMPLANT
SCREW BONE STANDARD (Screw) ×6 IMPLANT
SCREW BONE STD (Screw) IMPLANT
SCREW SPINAL STD (Orthopedic Implant) ×1 IMPLANT
SPONGE INTESTINAL PEANUT (DISPOSABLE) ×8 IMPLANT
SPONGE LAP 18X18 RF (DISPOSABLE) IMPLANT
SPONGE LAP 4X18 RFD (DISPOSABLE) IMPLANT
SPONGE SURGIFOAM ABS GEL 100 (HEMOSTASIS) ×3 IMPLANT
SURGIFLO W/THROMBIN 8M KIT (HEMOSTASIS) ×5 IMPLANT
SUT BONE WAX W31G (SUTURE) ×6 IMPLANT
SUT MON AB 3-0 SH 27 (SUTURE) ×2
SUT MON AB 3-0 SH27 (SUTURE) ×4 IMPLANT
SUT PDS AB 1 CTX 36 (SUTURE) ×3 IMPLANT
SUT PROLENE 5 0 C 1 24 (SUTURE) IMPLANT
SUT SILK 2 0 TIES 10X30 (SUTURE) ×5 IMPLANT
SUT SILK 3 0 TIES 10X30 (SUTURE) ×5 IMPLANT
SUT VIC AB 1 CT1 18XCR BRD 8 (SUTURE) IMPLANT
SUT VIC AB 1 CT1 27 (SUTURE) ×3
SUT VIC AB 1 CT1 27XBRD ANBCTR (SUTURE) ×6 IMPLANT
SUT VIC AB 1 CT1 8-18 (SUTURE) ×1
SUT VIC AB 2-0 CT1 18 (SUTURE) ×6 IMPLANT
SUT VICRYL 0 UR6 27IN ABS (SUTURE) ×1 IMPLANT
SYR 3ML LL SCALE MARK (SYRINGE) ×3 IMPLANT
SYR BULB IRRIGATION 50ML (SYRINGE) ×3 IMPLANT
SYR CONTROL 10ML LL (SYRINGE) ×3 IMPLANT
TOWEL GREEN STERILE (TOWEL DISPOSABLE) ×8 IMPLANT
TOWEL GREEN STERILE FF (TOWEL DISPOSABLE) ×6 IMPLANT
TRAP SPECIMEN MUCOUS 40CC (MISCELLANEOUS) ×2 IMPLANT
TRAY FOLEY W/BAG SLVR 14FR (SET/KITS/TRAYS/PACK) ×1 IMPLANT
WATER STERILE IRR 1000ML POUR (IV SOLUTION) ×5 IMPLANT
YANKAUER SUCT BULB TIP NO VENT (SUCTIONS) ×3 IMPLANT

## 2019-02-26 NOTE — Anesthesia Procedure Notes (Signed)
Procedure Name: Intubation Date/Time: 02/26/2019 7:46 AM Performed by: Lance Coon, CRNA Pre-anesthesia Checklist: Patient identified, Emergency Drugs available, Suction available, Patient being monitored and Timeout performed Patient Re-evaluated:Patient Re-evaluated prior to induction Oxygen Delivery Method: Circle system utilized Preoxygenation: Pre-oxygenation with 100% oxygen Induction Type: IV induction Ventilation: Mask ventilation without difficulty Laryngoscope Size: Miller and 3 Grade View: Grade I Tube type: Oral Tube size: 7.0 mm Number of attempts: 1 Airway Equipment and Method: Stylet Placement Confirmation: ETT inserted through vocal cords under direct vision,  positive ETCO2 and breath sounds checked- equal and bilateral Secured at: 21 cm Tube secured with: Tape Dental Injury: Teeth and Oropharynx as per pre-operative assessment

## 2019-02-26 NOTE — Brief Op Note (Signed)
02/26/2019  12:25 PM  PATIENT:  Kim Edwards  63 y.o. 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  PROCEDURE:  Procedure(s): Anterior lumbar fusion L4-5, Right far lateral disectomy (N/A) LUMBAR LAMINECTOMY/DECOMPRESSION MICRODISCECTOMY L4-5 Right far lateral disectomy (Right) ABDOMINAL EXPOSURE (N/A)  SURGEON:  Surgeon(s) and Role: Panel 1:    Melina Schools, MD - Primary Panel 2:    * Early, Arvilla Meres, MD - Primary  PHYSICIAN ASSISTANT:   ASSISTANTS: Amanda Ward   ANESTHESIA:   general  EBL:  100 mL   BLOOD ADMINISTERED:none  DRAINS: none   LOCAL MEDICATIONS USED:  MARCAINE   depomedrol    SPECIMEN:  No Specimen  DISPOSITION OF SPECIMEN:  N/A  COUNTS:  YES  TOURNIQUET:  * No tourniquets in log *  DICTATION: .Dragon Dictation  PLAN OF CARE: Admit to inpatient   PATIENT DISPOSITION:  PACU - hemodynamically stable.

## 2019-02-26 NOTE — Anesthesia Postprocedure Evaluation (Signed)
Anesthesia Post Note  Patient: Kim Edwards  Procedure(s) Performed: Anterior lumbar fusion L4-5, Right far lateral disectomy (N/A ) LUMBAR LAMINECTOMY/DECOMPRESSION MICRODISCECTOMY L4-5 Right far lateral disectomy (Right ) ABDOMINAL EXPOSURE (N/A )     Patient location during evaluation: PACU Anesthesia Type: General Level of consciousness: awake and alert and oriented Pain management: pain level controlled Vital Signs Assessment: post-procedure vital signs reviewed and stable Respiratory status: spontaneous breathing, nonlabored ventilation and respiratory function stable Cardiovascular status: blood pressure returned to baseline Postop Assessment: no apparent nausea or vomiting Anesthetic complications: no    Last Vitals:  Vitals:   02/26/19 1330 02/26/19 1401  BP:  117/62  Pulse: 82 94  Resp: (!) 9 16  Temp:  36.7 C  SpO2: 94% 93%    Last Pain:  Vitals:   02/26/19 1401  TempSrc: Oral  PainSc:                  Brennan Bailey

## 2019-02-26 NOTE — Anesthesia Procedure Notes (Signed)
Anesthesia Regional Block: TAP block   Pre-Anesthetic Checklist: ,, timeout performed, Correct Patient, Correct Site, Correct Laterality, Correct Procedure, Correct Position, site marked, Risks and benefits discussed, pre-op evaluation,  At surgeon's request and post-op pain management  Laterality: Left  Prep: Maximum Sterile Barrier Precautions used, chloraprep       Needles:  Injection technique: Single-shot  Needle Type: Echogenic Stimulator Needle     Needle Length: 9cm  Needle Gauge: 21     Additional Needles:   Procedures:,,,, ultrasound used (permanent image in chart),,,,  Narrative:  Start time: 02/26/2019 7:21 AM End time: 02/26/2019 7:24 AM Injection made incrementally with aspirations every 5 mL.  Performed by: Personally  Anesthesiologist: Brennan Bailey, MD  Additional Notes: Risks, benefits, and alternative discussed. Patient gave consent for procedure. Patient prepped and draped in sterile fashion. Sedation administered, patient remains easily responsive to voice. Relevant anatomy identified with ultrasound guidance. Local anesthetic given in 5cc increments with no signs or symptoms of intravascular injection. No pain or paraesthesias with injection. Patient monitored throughout procedure with signs of LAST or immediate complications. Tolerated well. Ultrasound image placed in chart. Tawny Asal, MD

## 2019-02-26 NOTE — Evaluation (Signed)
Physical Therapy Evaluation Patient Details Name: Kim Edwards MRN: [id number] DOB: 1955-08-09 Today's Date: 02/26/2019   History of Present Illness  Patient is a 63 y/o female admitted due to lumbar radiculopathy with HNP L3-4 and spolylolisthesis L4-5 now s/p ALIF L4-5 Right Far lateral Discectomy L3-4  Clinical Impression  Patient presents with decreased mobility following surgery needing min A overall to mobilize OOB and to ambulate in the hallway.  Has family/friend support for assist upon d/c.  Feel she will benefit from further instruction and to make sure does not need assistive device prior to d/c.  No follow up PT needed likely.     Follow Up Recommendations No PT follow up    Equipment Recommendations  None recommended by PT    Recommendations for Other Services       Precautions / Restrictions Precautions Precautions: Fall;Back Precaution Booklet Issued: Yes (comment) Required Braces or Orthoses: Spinal Brace Spinal Brace: Lumbar corset;Applied in sitting position Restrictions Weight Bearing Restrictions: No      Mobility  Bed Mobility Overal bed mobility: Needs Assistance Bed Mobility: Rolling;Sidelying to Sit;Sit to Sidelying Rolling: Min guard Sidelying to sit: Min guard     Sit to sidelying: Min guard General bed mobility comments: cues for procedure throughout  Transfers Overall transfer level: Needs assistance Equipment used: 1 person hand held assist Transfers: Sit to/from Stand Sit to Stand: Min assist         General transfer comment: some imbalance and pain limiting safety  Ambulation/Gait Ambulation/Gait assistance: Min assist Gait Distance (Feet): 200 Feet Assistive device: 1 person hand held assist Gait Pattern/deviations: Step-to pattern;Step-through pattern;Wide base of support     General Gait Details: A for balance and due to limping on R LE  Stairs            Wheelchair Mobility    Modified Rankin (Stroke Patients  Only)       Balance Overall balance assessment: Needs assistance   Sitting balance-Leahy Scale: Good       Standing balance-Leahy Scale: Fair Standing balance comment: assist for ambulation, S for wiping following toileting                             Pertinent Vitals/Pain Pain Assessment: 0-10 Pain Score: 4  Pain Location: surgical pain Pain Descriptors / Indicators: Grimacing;Operative site guarding Pain Intervention(s): Monitored during session;Repositioned    Home Living Family/patient expects to be discharged to:: Private residence Living Arrangements: Spouse/significant other Available Help at Discharge: Family;Friend(s) Type of Home: House Home Access: Stairs to enter Entrance Stairs-Rails: None Entrance Stairs-Number of Steps: 2 Home Layout: Multi-level;Able to live on main level with bedroom/bathroom Home Equipment: None      Prior Function Level of Independence: Independent               Hand Dominance        Extremity/Trunk Assessment   Upper Extremity Assessment Upper Extremity Assessment: Defer to OT evaluation    Lower Extremity Assessment Lower Extremity Assessment: Overall WFL for tasks assessed       Communication   Communication: No difficulties  Cognition Arousal/Alertness: Awake/alert Behavior During Therapy: WFL for tasks assessed/performed Overall Cognitive Status: Within Functional Limits for tasks assessed  General Comments      Exercises     Assessment/Plan    PT Assessment    PT Problem List         PT Treatment Interventions      PT Goals (Current goals can be found in the Care Plan section)  Acute Rehab PT Goals Patient Stated Goal: to go home PT Goal Formulation: With patient Time For Goal Achievement: 03/05/19 Potential to Achieve Goals: Good    Frequency     Barriers to discharge        Co-evaluation                AM-PAC PT "6 Clicks" Mobility  Outcome Measure Help needed turning from your back to your side while in a flat bed without using bedrails?: A Little Help needed moving from lying on your back to sitting on the side of a flat bed without using bedrails?: A Little Help needed moving to and from a bed to a chair (including a wheelchair)?: A Little Help needed standing up from a chair using your arms (e.g., wheelchair or bedside chair)?: A Little Help needed to walk in hospital room?: A Little Help needed climbing 3-5 steps with a railing? : A Little 6 Click Score: 18    End of Session Equipment Utilized During Treatment: Back brace Activity Tolerance: Patient limited by pain Patient left: in bed;with call bell/phone within reach;with family/visitor present   PT Visit Diagnosis: Difficulty in walking, not elsewhere classified (R26.2)    Time: 1642-1700 PT Time Calculation (min) (ACUTE ONLY): 18 min   Charges:   PT Evaluation $PT Eval Low Complexity: [place], [place] [phone] 02/26/2019   Reginia Naas 02/26/2019, 5:30 PM

## 2019-02-26 NOTE — H&P (Signed)
Addendum H&P  Patient presents today for surgical management of her severe back buttock and radicular right leg pain.  Imaging studies confirmed a far lateral disc herniation at L3-4 and advanced degenerative disc disease with spondylolisthesis at L4-5.  As result of the failure of conservative management we have elected to move forward with surgery.  I have again gone over the procedure as well as the risks and benefits and all of her questions were encouraged and addressed. Surgical plan: Anterior lumbar interbody fusion L4-5 to address the degenerative disc disease and degenerative spondylolisthesis.  Posterior right L3-4 foraminotomy and discectomy to address the neuropathic right leg pain due to the L3 foraminal disc herniation.

## 2019-02-26 NOTE — Op Note (Signed)
Operative report  Preoperative diagnosis:  1. 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 2.  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  Postoperative diagnosis: Same  Operative procedure: 1.  Right posterior foraminal discectomy via a [place] approach with right L3-4 foraminotomy 2.  Anterior lumbar interbody fusion L4-5 with peek intervertebral cage, and separate anterior plate.  Approach surgeon: Dr. Curt Edwards  First Assistant: Kim Alberts, PA  Implants: Peek RSB intervertebral cage.  35 x 14 x 12 degree lordosis.  Anterior 0 profile plate: 20 mm screw placed inferiorly 120 mm screw and another 25 mm screw placed superiorly.  Allograft: vivogen  Total anterior traction time was 65 minutes  History: This is a very pleasant 63 year old woman who is having severe back buttock and radicular leg pain.  Imaging studies confirmed a foraminal disc herniation at L3-4 on the right side as well as degenerative spondylolisthesis with spinal stenosis at L4-5.  Attempts at conservative care had failed to alleviate her symptoms and her quality of life continued to deteriorate.  As a result we elected to move forward with surgery.  All appropriate risks benefits and alternatives were discussed with the patient and consent was obtained.  Operative note.  Patient was brought the operating room placed supine on the operating room table.  After successful induction of general anesthesia and endotracheal ovation teds SCDs and a Foley were inserted.  The abdomen was prepped and draped in a standard fashion and a timeout was taken to confirm patient procedure and all other important data.  Intraoperative fluoroscopy views were used to identify the L4-5 level to mark out our incision site.  At this point Dr. Donnetta Edwards scrubbed in and performed a standard anterior retroperitoneal approach to the lumbar spine.  Once the [facility] retractors were  properly positioned and the L4-5 level confirmed Dr. Donnetta Edwards scrubbed out and I scrubbed back into the case.  Annulotomy was performed with a 10 blade scalpel and then I used pituitary rongeurs Cobb elevator and curettes to remove all the disc material.  Once I had removed all the disc material I used a curved curette to release the annulus from the posterior aspect of the L4 and L5 vertebral body.  This allowed me to use my 3 mm Kerrison to trim down the posterior osteophyte from the L4 and L5 vertebral body and undercut out laterally to aid in the foraminal decompression.  At this point had excellent discectomy.  Using a fine nerve hook I was able to pass it posterior to the vertebral body which was confirmed with fluoroscopy.  Again using live fluoroscopy I distracted and released the intervertebral space with a lamina spreader confirming had parallel endplate distraction.  At this point I trialed intervertebral spacers and elected use the size 14 mm - 12 degree lordotic spacer.  This restored the overall lordosis, reduced the spondylolisthesis, and maintained foraminal decompression.  With the endplates were rasped I then obtained the implant and packed it with the allograft.  The intervertebral spacer was then malleted to the appropriate depth.  Once I confirmed satisfactory position I then obtain the appropriate sized anterior plate and prepared to insert it the L5 anterior cortex was burred back in order to accommodate the lip of the 0 profile plate.  I then malleted the plate over the cage.  X-rays demonstrated no migration of the cage and proper positioning of the plate.  I then used the awl to broach the cortex and I  placed a single 25 mm left L4 screw and a 20 mm right L4 screw.  Both screws had excellent purchase.  I then placed an L5 20 mm length screw.  All 3 screws were secured and noted to have excellent purchase.  The small plate to prevent screw migration was then secured and torqued off according  manufacturer's point at this point the wound was copiously irrigated with normal saline I then sequentially began releasing the retractors confirming I had hemostasis.  Once all the retractors were removed I irrigated again and then closed the fascia of the rectus with a running #1 PDS suture.  I then closed the remainder of the wound in a layered fashion with a running 0 Vicryl, 2-0 Vicryl suture, and a 3-0 Monocryl on the skin.  Steri-Strips and a dry dressing were applied.  A final intraoperative AP x-ray was taken confirming that there was no retained surgical instruments in the field.  With the anterior procedure completed I then moved forward with the posterior  [name] and then [name] was secured to [name].  The patient was then turned prone onto [name].  All bony prominences well-padded and now the back was prepped and draped in a standard fashion.  Timeout was taken again confirming that we are moving forward with the L3-4 right-sided foraminotomy and discectomy  Fluoroscopy was used to identify the lateral aspect of the L3 pars and I marked out the incision site and infiltrated with quarter percent Marcaine.  A small incision was made approximately 1/2 fingerbreadths off of midline and I sharply dissected down to the deep fascia.  I incised the deep fascia and then began placing my blunt dilators down to the lateral aspect of the L3 pars.  I used fluoroscopy to confirm that I was at the appropriate position.  I sequentially dilated and then placed the final retractor.  I did help visualize the L3 pars.  I then placed a Penfield 4 along the lateral aspect of the pars and took a lateral x-ray to confirm that I was at the appropriate level.  Once confirmed I then used a 2 and 3 mm Kerrison punc to perform a lateral resection of the pars.  I took back just enough in order to identify the appropriate landmarks.  I then used my nerve hook to  dissect through the ligamentum flavum and then I remove this with a 2 mm Kerrison rongeur.  Using my Penfield 4 is able to palpate and identify the L3 nerve.  I then identified the epidural veins and coagulated them which allowed me to expose the posterior lateral aspect of the disc.  The lateral disc herniation then came into view.  An annulotomy was performed with a 15 blade scalpel and then I used my nerve hook to mobilize and remove 2 large fragments of disc material.  I then continue to sweep inferiorly and I removed an additional 2 fragments of disc.  The inferior location of the disc fragment was consistent with what was seen on the preoperative MRI.  With the multiple fragments of disc removed I could now easily mobilize the L3 nerve root.  I could easily pass my Woodson elevator superiorly along the medial aspect of the L3 pedicle and out into the foramen and beyond.  The L3 nerve root was no longer under tension.  Using bipolar electrocautery obtain hemostasis and maintained it with FloSeal.  After final irrigation  I did place approximately 20 mg (1/2 cc) of Depo-Medrol over the L3 nerve root to aid in postoperative analgesia.  Thrombin-soaked Gelfoam patty was placed over the foraminotomy site and I remove the retractor.  I then closed the deep fascia with interrupted #1 Vicryl suture.  Then closed another layer with 2-0 Vicryl suture and then 3-0 Monocryl for the skin.  Steri-Strips and a dry dressing were applied and the patient was ultimately extubated transfer the PACU without incident.  At the end of the case all needle sponge counts were correct.  There were no adverse intraoperative events.

## 2019-02-26 NOTE — Discharge Instructions (Signed)

## 2019-02-26 NOTE — Progress Notes (Signed)
Bilateral lower extremity venous duplex has been completed. Preliminary results can be found in CV Proc through chart review.   02/26/19 4:19 PM Kim Edwards RVT

## 2019-02-26 NOTE — Transfer of Care (Signed)
Immediate Anesthesia Transfer of Care Note  Patient: Kim Edwards  Procedure(s) Performed: Anterior lumbar fusion L4-5, Right far lateral disectomy (N/A ) LUMBAR LAMINECTOMY/DECOMPRESSION MICRODISCECTOMY L4-5 Right far lateral disectomy (Right ) ABDOMINAL EXPOSURE (N/A )  Patient Location: PACU  Anesthesia Type:General  Level of Consciousness: drowsy  Airway & Oxygen Therapy: Patient Spontanous Breathing  Post-op Assessment: Report given to RN and Post -op Vital signs reviewed and stable  Post vital signs: Reviewed and stable  Last Vitals:  Vitals Value Taken Time  BP    Temp    Pulse 96 02/26/19 1233  Resp 12 02/26/19 1233  SpO2 94 % 02/26/19 1233  Vitals shown include unvalidated device data.  Last Pain:  Vitals:   02/26/19 0651  TempSrc:   PainSc: 0-No pain         Complications: No apparent anesthesia complications

## 2019-02-26 NOTE — Anesthesia Procedure Notes (Signed)
Arterial Line Insertion Start/End11/08/2018 7:05 AM, 02/26/2019 7:15 AM Performed by: Lance Coon, CRNA, CRNA  Preanesthetic checklist: patient identified, IV checked, site marked, risks and benefits discussed, surgical consent, monitors and equipment checked, pre-op evaluation, timeout performed and anesthesia consent Lidocaine 1% used for infiltration and patient sedated Left, radial was placed Catheter size: 20 G Hand hygiene performed , maximum sterile barriers used  and Seldinger technique used  Attempts: 2 Procedure performed without using ultrasound guided technique. Following insertion, dressing applied and Biopatch. Post procedure assessment: normal and unchanged  Patient tolerated the procedure well with no immediate complications.

## 2019-02-26 NOTE — Op Note (Signed)
    OPERATIVE REPORT  DATE OF SURGERY: 02/26/2019  PATIENT: Kim Edwards, 63 y.o. female MRN: [id number]  DOB: Dec 17, 1955  PRE-OPERATIVE DIAGNOSIS: Degenerative disc disease  POST-OPERATIVE DIAGNOSIS:  Same  PROCEDURE: Anterior exposure for L4-5 disc fusion  SURGEON:  Curt Jews, M.D.  Co-surgeon for the exposure Dr. Melina Schools  ANESTHESIA: General  EBL: per anesthesia record  Total I/O In: 1000 [I.V.:1000] Out: 300 [Urine:300]  BLOOD ADMINISTERED: none  DRAINS: none  SPECIMEN: none  COUNTS CORRECT:  YES  PATIENT DISPOSITION:  PACU - hemodynamically stable  PROCEDURE DETAILS: Patient was taken the operating placed in supine position where the area of the abdomen was prepped and draped in usual sterile fashion.  Crosstable lateral C arm was used to visualize the level of the 4 5 disc in relation to the anterior abdominal wall.  Incision was made from the midline to the left over this area and carried down through the anterior rectus sheath with electrocautery.  The rectus muscle was mobilized circumferentially.  The rectus muscle was mobilized to the right and the retroperitoneal space was entered.  The posterior rectus sheath was opened laterally.  Blunt dissection was continued in the retroperitoneal space over the level of the psoas muscle and to the level of the L4-5 disc.  The arterial venous structures were mobilized to the right.  Patient had 2 large iliolumbar veins and these were ligated with 2-0 silk ties and divided.  This allowed continued blunt dissection over the 4 5 disc space.  The Thompson retractor was brought onto the field and the reverse lip 150 blades were positioned to the right and left of the L4-5 disc.  Malleable retractors were used for superior and inferior exposure.  A spinal needle was placed in the 4 5 disc and C-arm was brought back onto the field to confirm that this was the appropriate level.  The remainder of the procedure will be dictated  as a separate note by Dr. Rolena Infante.   Kim Edwards, M.D., [facility] 02/26/2019 9:43 AM

## 2019-02-27 ENCOUNTER — Encounter (HOSPITAL_COMMUNITY): Payer: Self-pay | Admitting: Orthopedic Surgery

## 2019-02-27 MED FILL — Sodium Chloride IV Soln 0.9%: INTRAVENOUS | Qty: 1000 | Status: AC

## 2019-02-27 MED FILL — Heparin Sodium (Porcine) Inj 1000 Unit/ML: INTRAMUSCULAR | Qty: 30 | Status: AC

## 2019-02-27 MED FILL — Thrombin (Recombinant) For Soln 20000 Unit: CUTANEOUS | Qty: 1 | Status: AC

## 2019-02-27 NOTE — Progress Notes (Signed)
    Subjective: Procedure(s) (LRB): Anterior lumbar fusion L4-5, Right far lateral disectomy (N/A) LUMBAR LAMINECTOMY/DECOMPRESSION MICRODISCECTOMY L4-5 Right far lateral disectomy (Right) ABDOMINAL EXPOSURE (N/A) 1 Day Post-Op  Patient reports pain as 2 on 0-10 scale.  Reports decreased leg pain reports incisional back pain   Positive void Negative bowel movement Positive flatus Negative chest pain or shortness of breath  Objective: Vital signs in last 24 hours: Temp:  [97.8 F (36.6 C)-98.3 F (36.8 C)] 98.1 F (36.7 C) (11/06 0713) Pulse Rate:  [67-94] 68 (11/06 0713) Resp:  [7-18] 16 (11/06 0713) BP: (101-139)/(45-64) 103/55 (11/06 0713) SpO2:  [92 %-97 %] 96 % (11/06 0713) Arterial Line BP: (151-155)/(65-68) 154/66 (11/05 1302)  Intake/Output from previous day: 11/05 0701 - 11/06 0700 In: 2860 [P.O.:360; I.V.:2500] Out: 1450 [Urine:1350; Blood:100]  Labs: Recent Labs    02/26/19 1240  WBC 9.0  RBC 3.67*  HCT 35.1*  PLT 194   Recent Labs    02/26/19 1240  CREATININE 1.07*   No results for input(s): LABPT, INR in the last 72 hours.  Physical Exam: Neurologically intact ABD soft Intact pulses distally Dorsiflexion/Plantar flexion intact Incision: dressing C/D/I and no drainage Compartment soft Body mass index is 26.52 kg/m.   Assessment/Plan: Patient stable  xrays n/a Continue mobilization with physical therapy Continue care  Advance diet Up with therapy  1.  DVT scan negative. 2.  Patient is ambulating the hallway with minimal pain and discomfort.  Radicular leg pain is all but resolved. 3.  Patient doing well overall.  Dressings are clean dry and intact.  Once she is cleared by PT she can be discharged home today.  Alternatively if she is having slight increased pain or concerns it is okay for her to be discharged tomorrow.   Melina Schools, MD [facility] [phone]

## 2019-02-27 NOTE — Progress Notes (Signed)
Physical Therapy Treatment Patient Details Name: Kim Edwards MRN: [id number] DOB: 1956/04/06 Today's Date: 02/27/2019    History of Present Illness Patient is a 63 y/o female admitted due to lumbar radiculopathy with HNP L3-4 and spolylolisthesis L4-5 now s/p ALIF L4-5 Right Far lateral Discectomy L3-4    PT Comments    Patient progressing well towards PT goals. Improved ambulation distance with supervision for safety. Tolerated stair training with cues for technique and safety. Pt able to recall back precautions and needed cues to adhere to them during functional mobility. Reviewed positioning, log roll technique and walking program. Pt reports having a support system at home. Will follow.    Follow Up Recommendations  No PT follow up     Equipment Recommendations  None recommended by PT    Recommendations for Other Services       Precautions / Restrictions Precautions Precautions: Fall;Back Precaution Booklet Issued: Yes (comment) Precaution Comments: Reviewed precautions Required Braces or Orthoses: Spinal Brace Spinal Brace: Lumbar corset;Applied in sitting position Restrictions Weight Bearing Restrictions: No    Mobility  Bed Mobility Overal bed mobility: Needs Assistance Bed Mobility: Rolling;Sidelying to Sit Rolling: Modified independent (Device/Increase time) Sidelying to sit: Modified independent (Device/Increase time)       General bed mobility comments: HOB flat and no use of rails to simulate home. good demo of log roll technique.  Transfers Overall transfer level: Needs assistance Equipment used: None Transfers: Sit to/from Stand Sit to Stand: Supervision         General transfer comment: Supervision for safety.  Ambulation/Gait Ambulation/Gait assistance: Supervision Gait Distance (Feet): 400 Feet Assistive device: None Gait Pattern/deviations: Step-through pattern;Decreased stride length Gait velocity: decreased   General Gait Details:  Slow, steady gait without use of RW. Some mild pain with movement which improved with increased distance.   Stairs Stairs: Yes Stairs assistance: Supervision Stair Management: One rail Left Number of Stairs: 2 General stair comments: Cues for technique and safety.   Wheelchair Mobility    Modified Rankin (Stroke Patients Only)       Balance Overall balance assessment: Mild deficits observed, not formally tested   Sitting balance-Leahy Scale: Good Sitting balance - Comments: Able to donn brace independently sitting EOB.   Standing balance support: During functional activity Standing balance-Leahy Scale: Good                              Cognition Arousal/Alertness: Awake/alert Behavior During Therapy: WFL for tasks assessed/performed Overall Cognitive Status: Within Functional Limits for tasks assessed                                        Exercises      General Comments General comments (skin integrity, edema, etc.): Bandage- clean dry and intact.      Pertinent Vitals/Pain Pain Assessment: 0-10 Pain Score: 5  Pain Location: surgical pain Pain Descriptors / Indicators: Grimacing;Operative site guarding Pain Intervention(s): Repositioned;Monitored during session    Home Living                      Prior Function            PT Goals (current goals can now be found in the care plan section) Progress towards PT goals: Progressing toward goals    Frequency    Min 5X/week  PT Plan Current plan remains appropriate    Co-evaluation              AM-PAC PT "6 Clicks" Mobility   Outcome Measure  Help needed turning from your back to your side while in a flat bed without using bedrails?: None Help needed moving from lying on your back to sitting on the side of a flat bed without using bedrails?: None Help needed moving to and from a bed to a chair (including a wheelchair)?: None Help needed standing up  from a chair using your arms (e.g., wheelchair or bedside chair)?: None Help needed to walk in hospital room?: A Little Help needed climbing 3-5 steps with a railing? : A Little 6 Click Score: 22    End of Session Equipment Utilized During Treatment: Back brace Activity Tolerance: Patient tolerated treatment well Patient left: in bed;with call bell/phone within reach(sitting EOB eating breakfast) Nurse Communication: Mobility status PT Visit Diagnosis: Difficulty in walking, not elsewhere classified (R26.2);Pain Pain - part of body: (back)     Time: [id number] PT Time Calculation (min) (ACUTE ONLY): 14 min  Charges:  $Gait Training: 8-22 mins                     Marisa Severin, PT, DPT Acute Rehabilitation Services Pager [phone] Office [place] 02/27/2019, 8:59 AM

## 2019-02-27 NOTE — Plan of Care (Signed)
Patient alert and oriented, mae's well, voiding adequate amount of urine, swallowing without difficulty, no c/o pain at time of discharge. Patient discharged home with family. Script and discharged instructions given to patient. Patient and family stated understanding of instructions given. Patient has an appointment with Dr. Brooks  

## 2019-02-27 NOTE — Evaluation (Signed)
Occupational Therapy Evaluation Patient Details Name: Kim Edwards MRN: [id number] DOB: May 28, 1955 Today's Date: 02/27/2019    History of Present Illness Patient is a 63 y/o female admitted due to lumbar radiculopathy with HNP L3-4 and spolylolisthesis L4-5 now s/p ALIF L4-5 Right Far lateral Discectomy L3-4   Clinical Impression   All education completed with pt verbalizing and/or demonstrating understanding. She has reliable assistance at home for IADL. No further OT needs.    Follow Up Recommendations  No OT follow up    Equipment Recommendations  None recommended by OT    Recommendations for Other Services       Precautions / Restrictions Precautions Precautions: Fall;Back Precaution Booklet Issued: Yes (comment) Precaution Comments: Reviewed precautions Required Braces or Orthoses: Spinal Brace Spinal Brace: Lumbar corset;Applied in sitting position Restrictions Weight Bearing Restrictions: No      Mobility Bed Mobility Overal bed mobility: Modified Independent Bed Mobility: Rolling;Sidelying to Sit Rolling: Modified independent (Device/Increase time) Sidelying to sit: Modified independent (Device/Increase time)       General bed mobility comments: used log roll technique, increased time  Transfers Overall transfer level: Needs assistance Equipment used: None Transfers: Sit to/from Stand Sit to Stand: Supervision         General transfer comment: Supervision for safety, increased time    Balance Overall balance assessment: Mild deficits observed, not formally tested   Sitting balance-Leahy Scale: Good Sitting balance - Comments: Able to donn brace independently sitting EOB.   Standing balance support: During functional activity Standing balance-Leahy Scale: Good                             ADL either performed or assessed with clinical judgement   ADL Overall ADL's : Modified independent                                       General ADL Comments: Educated pt in two cup method for brushing teeth, using wash cloth instead of bending over sink to wash face, avoiding twisiting with pericare, use of toilet tongs and long handled bath sponge. Pt able to cross foot over opposite knee to don socks     Vision Baseline Vision/History: Wears glasses Wears Glasses: At all times Patient Visual Report: No change from baseline       Perception     Praxis      Pertinent Vitals/Pain Pain Assessment: Faces Pain Score: 5  Faces Pain Scale: Hurts little more Pain Location: surgical pain Pain Descriptors / Indicators: Grimacing;Operative site guarding Pain Intervention(s): Monitored during session;Repositioned     Hand Dominance Right   Extremity/Trunk Assessment Upper Extremity Assessment Upper Extremity Assessment: Overall WFL for tasks assessed   Lower Extremity Assessment Lower Extremity Assessment: Overall WFL for tasks assessed       Communication Communication Communication: No difficulties   Cognition Arousal/Alertness: Awake/alert Behavior During Therapy: WFL for tasks assessed/performed Overall Cognitive Status: Within Functional Limits for tasks assessed                                     General Comments  Bandage- clean dry and intact.    Exercises     Shoulder Instructions      Home Living Family/patient expects to be discharged to:: Private residence Living Arrangements:  Spouse/significant other Available Help at Discharge: Family;Friend(s) Type of Home: House Home Access: Stairs to enter [employer] of Steps: 2 Entrance Stairs-Rails: None Home Layout: Multi-level;Able to live on main level with bedroom/bathroom     Bathroom Shower/Tub: Teacher, early years/pre: Standard     Home Equipment: Grab bars - tub/shower, shower seat, BSC          Prior Functioning/Environment Level of Independence: Independent        Comments:  Freight forwarder at [employer]        OT Problem List:        OT Treatment/Interventions:      OT Goals(Current goals can be found in the care plan section) Acute Rehab OT Goals Patient Stated Goal: to go home  OT Frequency:     Barriers to D/C:            Co-evaluation              AM-PAC OT "6 Clicks" Daily Activity     Outcome Measure Help from another person eating meals?: None Help from another person taking care of personal grooming?: None Help from another person toileting, which includes using toliet, bedpan, or urinal?: None Help from another person bathing (including washing, rinsing, drying)?: None Help from another person to put on and taking off regular upper body clothing?: None Help from another person to put on and taking off regular lower body clothing?: None 6 Click Score: 24   End of Session Equipment Utilized During Treatment: Back brace  Activity Tolerance: Patient tolerated treatment well Patient left: in bed;with call bell/phone within reach  OT Visit Diagnosis: Pain                Time: [id number] OT Time Calculation (min): 22 min Charges:  OT General Charges $OT Visit: 1 Visit OT Evaluation $OT Eval Low Complexity: 1 Low  Nestor Lewandowsky, OTR/L Acute Rehabilitation Services Pager: [phone] Office: [phone]  Malka So 02/27/2019, 10:22 AM

## 2019-03-04 NOTE — Discharge Summary (Signed)
Patient ID: Kim Edwards MRN: [id number] DOB/AGE: Sep 19, 1955 63 y.o.  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 Patient failed conservative treatments (please see the history and physical for the specifics) and had severe unremitting pain that affects sleep, daily activities and work/hobbies. After pre-op clearance, the patient was taken to the operating room on 02/26/2019 and underwent  Procedure(s): Anterior lumbar fusion L4-5, Right far lateral disectomy LUMBAR LAMINECTOMY/DECOMPRESSION MICRODISCECTOMY L4-5 Right far lateral disectomy ABDOMINAL EXPOSURE.    Patient was given perioperative antibiotics:  Anti-infectives (From admission, onward)   Start     Dose/Rate Route Frequency Ordered Stop   02/26/19 2000   ceFAZolin (ANCEF) IVPB 1 g/50 mL premix     1 g 100 mL/hr over 30 Minutes Intravenous Every 8 hours 02/26/19 1356 02/27/19 0426   02/26/19 0640  ceFAZolin (ANCEF) IVPB 2g/100 mL premix     2 g 200 mL/hr over 30 Minutes Intravenous 30 min pre-op 02/26/19 0640 02/26/19 1150       Patient was given sequential compression devices and early ambulation to prevent DVT.   Patient benefited maximally from hospital stay and there were no complications. At the time of discharge, the patient was urinating/moving their bowels without difficulty, tolerating a regular diet, pain is controlled with oral pain medications and they have been cleared by PT/OT.   Recent vital signs: No data found.   Recent laboratory studies: No results for input(s): WBC, HGB, HCT, PLT, NA, K, CL, CO2, BUN, CREATININE, GLUCOSE, INR, CALCIUM in the last 72 hours.  Invalid input(s): PT, 2   Discharge Medications:   Allergies as of 02/27/2019      Reactions   Buspar [buspirone Hcl] Other (See Comments)   Joints hurt   Oxycodone-acetaminophen Other (See Comments)   upset stomach      Medication List    STOP taking these medications   cyclobenzaprine 10 MG tablet Commonly known as: FLEXERIL   FISH OIL PO   gabapentin 300 MG capsule Commonly known as: NEURONTIN   HYDROcodone-acetaminophen 5-325 MG tablet Commonly known as: NORCO/VICODIN   multivitamin with minerals Tabs tablet   predniSONE 50 MG tablet Commonly known as: DELTASONE   traMADol 50 MG tablet Commonly known as: ULTRAM   traZODone 50 MG tablet Commonly known as: DESYREL     TAKE these medications   enoxaparin 40 MG/0.4ML injection Commonly known as: LOVENOX Inject 0.4 mLs (40 mg total) into the skin daily for  10 days. 10 day supply 1 injection per day   fenofibrate 160 MG tablet TAKE ONE TABLET BY MOUTH DAILY   ondansetron 4 MG tablet Commonly known as: Zofran Take 1 tablet (4 mg total) by mouth every 8 (eight) hours as needed for  nausea or vomiting.   rosuvastatin 20 MG tablet Commonly known as: CRESTOR TAKE ONE TABLET BY MOUTH DAILY     ASK your doctor about these medications   HYDROcodone-acetaminophen 10-325 MG tablet Commonly known as: Norco Take 1 tablet by mouth every 6 (six) hours as needed for up to 5 days. Ask about: Should I take this medication?   methocarbamol 500 MG tablet Commonly known as: Robaxin Take 1 tablet (500 mg total) by mouth every 8 (eight) hours as needed for up to 5 days for muscle spasms. Ask about: Should I take this medication?       Diagnostic Studies: Dg Chest 2 View  Result Date: 02/23/2019 CLINICAL DATA:  Preoperative assessment for spine surgery EXAM: CHEST - 2 VIEW COMPARISON:  August 16, 2011 chest radiograph and chest CT Aug 29, 2011 FINDINGS: Lungs are clear. Heart size and pulmonary vascularity are normal. No adenopathy. No bone lesions. IMPRESSION: No edema or consolidation. Electronically Signed   By: Lowella Grip III M.D.   On: 02/23/2019 14:20   Dg Lumbar Spine 2-3 Views  Result Date: 02/26/2019 CLINICAL DATA:  63 year old female with lumbar surgery EXAM: LUMBAR SPINE - 2-3 VIEW; DG C-ARM 1-60 MIN COMPARISON:  None. FINDINGS: Limited intraoperative fluoroscopic spot images of the lumbar spine demonstrates anterior lumbar interbody fusion of L4-L5, as well as surgical curette identifying the L3 neural foramen. No complicating features IMPRESSION: Limited intraoperative fluoroscopic spot images of the lumbar spine, as above. Please refer to the dictated operative report for full details of intraoperative findings and procedure. Electronically Signed   By: Corrie Mckusick D.O.   On: 02/26/2019 12:09   Dg C-arm 1-60 Min  Result Date: 02/26/2019 CLINICAL DATA:  63 year old female with lumbar surgery EXAM: LUMBAR SPINE - 2-3 VIEW; DG C-ARM 1-60 MIN COMPARISON:  None. FINDINGS: Limited intraoperative fluoroscopic spot images of the lumbar spine demonstrates anterior lumbar  interbody fusion of L4-L5, as well as surgical curette identifying the L3 neural foramen. No complicating features IMPRESSION: Limited intraoperative fluoroscopic spot images of the lumbar spine, as above. Please refer to the dictated operative report for full details of intraoperative findings and procedure. Electronically Signed   By: Corrie Mckusick D.O.   On: 02/26/2019 12:09   Vas [place] Lower Extremity Venous (dvt)  Result Date: 02/28/2019  Lower Venous Study Indications: S/P ALIF.  Risk Factors: Surgery. Comparison Study: No prior studies. Performing Technologist: Oliver Hum RVT  Examination Guidelines: A complete evaluation includes B-mode imaging, spectral Doppler, color Doppler, and power Doppler as needed of all accessible portions of each vessel. Bilateral testing is considered an integral part of a complete examination. Limited examinations for reoccurring indications may be performed as noted.  +---------+---------------+---------+-----------+----------+--------------+  RIGHT     Compressibility Phasicity Spontaneity Properties Thrombus Aging  +---------+---------------+---------+-----------+----------+--------------+  CFV       Full            Yes       Yes                                    +---------+---------------+---------+-----------+----------+--------------+  SFJ       Full                                                             +---------+---------------+---------+-----------+----------+--------------+  FV Prox   Full                                                             +---------+---------------+---------+-----------+----------+--------------+  FV Mid    Full                                                             +---------+---------------+---------+-----------+----------+--------------+  FV Distal Full                                                             +---------+---------------+---------+-----------+----------+--------------+  PFV       Full                                                              +---------+---------------+---------+-----------+----------+--------------+  POP       Full            Yes       Yes                                    +---------+---------------+---------+-----------+----------+--------------+  PTV       Full                                                             +---------+---------------+---------+-----------+----------+--------------+  PERO      Full                                                             +---------+---------------+---------+-----------+----------+--------------+   +---------+---------------+---------+-----------+----------+--------------+  LEFT      Compressibility Phasicity Spontaneity Properties Thrombus Aging  +---------+---------------+---------+-----------+----------+--------------+  CFV       Full            Yes       Yes                                    +---------+---------------+---------+-----------+----------+--------------+  SFJ       Full                                                             +---------+---------------+---------+-----------+----------+--------------+  FV Prox   Full                                                             +---------+---------------+---------+-----------+----------+--------------+  FV Mid    Full                                                             +---------+---------------+---------+-----------+----------+--------------+  FV Distal Full                                                             +---------+---------------+---------+-----------+----------+--------------+  PFV       Full                                                             +---------+---------------+---------+-----------+----------+--------------+  POP       Full            Yes       Yes                                    +---------+---------------+---------+-----------+----------+--------------+  PTV       Full                                                              +---------+---------------+---------+-----------+----------+--------------+  PERO      Full                                                             +---------+---------------+---------+-----------+----------+--------------+     Summary: Right: There is no evidence of deep vein thrombosis in the lower extremity. No cystic structure found in the popliteal fossa. Left: There is no evidence of deep vein thrombosis in the lower extremity. No cystic structure found in the popliteal fossa.  *See table(s) above for measurements and observations. Electronically signed by Curt Jews MD on 02/28/2019 at 7:05:40 AM.    Final    Dg Or Local Abdomen  Result Date: 02/26/2019 CLINICAL DATA:  Sponge count. EXAM: OR LOCAL ABDOMEN COMPARISON:  Lumbar spine 02/26/2019. FINDINGS: Right lateral portion of the abdomen and pelvis incompletely imaged. No radiopacities noted to suggest a retained surgical sponge. No bowel distention. Lower lumbar spine fusion. Hardware intact. Anatomic alignment on  AP view. Pelvic calcifications consistent phleboliths. IMPRESSION: No radiopacities noted to suggest a retained surgical sponge Electronically Signed   By: [place]   On: 02/26/2019 10:15    Discharge Instructions    Incentive spirometry RT   Complete by: As directed       Follow-up Information    Melina Schools, MD. Call today.   Specialty: Orthopedic Surgery Why: If symptoms worsen, For suture removal, For wound re-check Contact information: [address] [place] [address] [id number]           Discharge Plan:  discharge to home  Disposition: stable    Signed: Yvonne Kendall Oskar Cretella for [facility] PA-C [facility] [phone] 03/04/2019, 1:35 PM

## 2019-04-03 ENCOUNTER — Other Ambulatory Visit: Payer: Self-pay | Admitting: Family Medicine

## 2019-05-27 ENCOUNTER — Telehealth: Payer: Self-pay | Admitting: Family Medicine

## 2019-05-27 MED ORDER — FENOFIBRATE 160 MG PO TABS: 160.0000 mg | ORAL_TABLET | Freq: Every day | ORAL | 0 refills | Status: DC

## 2019-05-27 MED ORDER — ROSUVASTATIN CALCIUM 20 MG PO TABS: 20.0000 mg | ORAL_TABLET | Freq: Every day | ORAL | 0 refills | Status: DC

## 2019-05-27 NOTE — Telephone Encounter (Signed)
I filled these prescriptions. It appears as though pt is overdue a follow up for cholesterol.

## 2019-05-27 NOTE — Telephone Encounter (Signed)
Called pt, advised Rx's filled and Chol check overdue. Pt understood and she will call back to reschedule.

## 2019-05-27 NOTE — Telephone Encounter (Signed)
Pt called in asking for refills on the Fenofibrate an the Rosuvastatin, pt uses [facility].

## 2019-07-06 ENCOUNTER — Other Ambulatory Visit: Payer: Self-pay

## 2019-07-06 ENCOUNTER — Encounter: Payer: Self-pay | Admitting: Family Medicine

## 2019-07-06 ENCOUNTER — Ambulatory Visit (INDEPENDENT_AMBULATORY_CARE_PROVIDER_SITE_OTHER): Payer: Managed Care, Other (non HMO) | Admitting: Family Medicine

## 2019-07-06 VITALS — BP 128/70 | HR 76 | Temp 97.9°F | Resp 16 | Ht 62.0 in | Wt 149.0 lb

## 2019-07-06 DIAGNOSIS — M5416 Radiculopathy, lumbar region: Secondary | ICD-10-CM

## 2019-07-06 DIAGNOSIS — E663 Overweight: Secondary | ICD-10-CM | POA: Diagnosis not present

## 2019-07-06 DIAGNOSIS — E785 Hyperlipidemia, unspecified: Secondary | ICD-10-CM | POA: Diagnosis not present

## 2019-07-06 LAB — HEPATIC FUNCTION PANEL
ALT: 27 U/L (ref 0–35)
AST: 48 U/L — ABNORMAL HIGH (ref 0–37)
Albumin: 4.1 g/dL (ref 3.5–5.2)
Alkaline Phosphatase: 32 U/L — ABNORMAL LOW (ref 39–117)
Bilirubin, Direct: 0.1 mg/dL (ref 0.0–0.3)
Total Bilirubin: 0.6 mg/dL (ref 0.2–1.2)
Total Protein: 6.6 g/dL (ref 6.0–8.3)

## 2019-07-06 LAB — BASIC METABOLIC PANEL
BUN: 13 mg/dL (ref 6–23)
CO2: 26 mEq/L (ref 19–32)
Calcium: 9.4 mg/dL (ref 8.4–10.5)
Chloride: 101 mEq/L (ref 96–112)
Creatinine, Ser: 0.68 mg/dL (ref 0.40–1.20)
GFR: 87.79 mL/min (ref >60.00)
Glucose, Bld: 114 mg/dL — ABNORMAL HIGH (ref 70–99)
Potassium: 4 mEq/L (ref 3.5–5.1)
Sodium: 137 mEq/L (ref 135–145)

## 2019-07-06 LAB — LDL CHOLESTEROL, DIRECT: Direct LDL: 149 mg/dL

## 2019-07-06 LAB — LIPID PANEL
Cholesterol: 259 mg/dL — ABNORMAL HIGH (ref 0–200)
HDL: 72 mg/dL (ref >39.00)
NonHDL: 186.68
Total CHOL/HDL Ratio: 4
Triglycerides: 379 mg/dL — ABNORMAL HIGH (ref 0.0–149.0)
VLDL: 75.8 mg/dL — ABNORMAL HIGH (ref 0.0–40.0)

## 2019-07-06 NOTE — Patient Instructions (Signed)
Schedule your complete physical in 6 months We'll notify you of your lab results and make any changes if needed Exercise as you are able! Call with any questions or concerns Stay Safe!  Stay Healthy!!

## 2019-07-06 NOTE — Assessment & Plan Note (Signed)
Chronic problem.  Tolerating Crestor and Fenofibrate w/o difficulty.  Check labs.  Adjust meds prn

## 2019-07-06 NOTE — Progress Notes (Signed)
   Subjective:    Patient ID: Kim Edwards, female    DOB: Jan 11, 1956, 64 y.o.   MRN: [id number]  HPI Hyperlipidemia- chronic problem, on Crestor 20mg  and Fenofibrate 160mg  daily.  Pt has not been able to exercise due to recent back surgery.  Denies CP, SOB, abd pain, N/V.  Overweight- pt has gained 5 lbs since last visit.  Not able to exercise.  Lumbar radiculopathy- pt states pain is completely gone s/p surgery.  Just went back to work early Feb.  Not able to exercise.  Plans to get COVID vaccine   Review of Systems For ROS see HPI    This visit occurred during the SARS-CoV-2 public health emergency.  Safety protocols were in place, including screening questions prior to the visit, additional usage of staff PPE, and extensive cleaning of exam room while observing appropriate contact time as indicated for disinfecting solutions.       Objective:   Physical Exam Vitals reviewed.  Constitutional:      General: She is not in acute distress.    Appearance: Normal appearance. She is well-developed.  HENT:     Head: Normocephalic and atraumatic.  Eyes:     Conjunctiva/sclera: Conjunctivae normal.     Pupils: Pupils are equal, round, and reactive to light.  Neck:     Thyroid: No thyromegaly.  Cardiovascular:     Rate and Rhythm: Normal rate and regular rhythm.     Heart sounds: Normal heart sounds. No murmur.  Pulmonary:     Effort: Pulmonary effort is normal. No respiratory distress.     Breath sounds: Normal breath sounds.  Abdominal:     General: There is no distension.     Palpations: Abdomen is soft.     Tenderness: There is no abdominal tenderness.  Musculoskeletal:     Cervical back: Normal range of motion and neck supple.  Lymphadenopathy:     Cervical: No cervical adenopathy.  Skin:    General: Skin is warm and dry.  Neurological:     Mental Status: She is alert and oriented to person, place, and time.  Psychiatric:        Behavior: Behavior normal.            Assessment & Plan:

## 2019-07-06 NOTE — Assessment & Plan Note (Signed)
Pt has gained 5 lbs since last visit.  Encouraged her to exercise once she was able.  Will follow.

## 2019-07-06 NOTE — Assessment & Plan Note (Signed)
Resolved s/p laminectomy.  Pt reports pain is gone.  She is very pleased w/ outcome but reports it was a very difficult surgery/recovery.  Still unable to exercise.

## 2019-07-07 ENCOUNTER — Other Ambulatory Visit (INDEPENDENT_AMBULATORY_CARE_PROVIDER_SITE_OTHER): Payer: Managed Care, Other (non HMO)

## 2019-07-07 DIAGNOSIS — R7309 Other abnormal glucose: Secondary | ICD-10-CM

## 2019-07-07 LAB — HEMOGLOBIN A1C: Hgb A1c MFr Bld: 5.9 % (ref 4.6–6.5)

## 2019-07-08 ENCOUNTER — Other Ambulatory Visit: Payer: Self-pay | Admitting: General Practice

## 2019-07-08 MED ORDER — FENOFIBRATE 160 MG PO TABS: 160.0000 mg | ORAL_TABLET | Freq: Every day | ORAL | 1 refills | Status: DC

## 2019-07-08 MED ORDER — ROSUVASTATIN CALCIUM 20 MG PO TABS: 20.0000 mg | ORAL_TABLET | Freq: Every day | ORAL | 1 refills | Status: DC

## 2019-07-29 ENCOUNTER — Other Ambulatory Visit: Payer: Self-pay | Admitting: Family Medicine

## 2019-11-12 ENCOUNTER — Other Ambulatory Visit: Payer: Self-pay | Admitting: Family Medicine

## 2019-12-30 ENCOUNTER — Telehealth: Payer: Self-pay | Admitting: Family Medicine

## 2019-12-30 ENCOUNTER — Other Ambulatory Visit: Payer: Self-pay

## 2019-12-30 ENCOUNTER — Encounter (HOSPITAL_BASED_OUTPATIENT_CLINIC_OR_DEPARTMENT_OTHER): Payer: Self-pay | Admitting: *Deleted

## 2019-12-30 DIAGNOSIS — T7840XA Allergy, unspecified, initial encounter: Secondary | ICD-10-CM | POA: Diagnosis present

## 2019-12-30 DIAGNOSIS — Z79899 Other long term (current) drug therapy: Secondary | ICD-10-CM | POA: Insufficient documentation

## 2019-12-30 DIAGNOSIS — K1379 Other lesions of oral mucosa: Secondary | ICD-10-CM | POA: Insufficient documentation

## 2019-12-30 DIAGNOSIS — Z87891 Personal history of nicotine dependence: Secondary | ICD-10-CM | POA: Diagnosis not present

## 2019-12-30 MED ORDER — DEXAMETHASONE SODIUM PHOSPHATE 10 MG/ML IJ SOLN
10.0000 mg | Freq: Once | INTRAMUSCULAR | Status: AC
  Administered 2019-12-31: 10 mg via INTRAMUSCULAR
  Filled 2019-12-30: qty 1

## 2019-12-30 MED ORDER — FAMOTIDINE 20 MG PO TABS
20.0000 mg | ORAL_TABLET | Freq: Once | ORAL | Status: AC
  Administered 2019-12-31: 20 mg via ORAL
  Filled 2019-12-30: qty 1

## 2019-12-30 NOTE — Telephone Encounter (Signed)
FYI. I called and spoke with pt. She advised that she received a steroid shot about 1.5 weeks ago from Dr. Nelva Bush. She says that she is unsure if she is having a reaction (could not tell me what the steroid was). Pt advised that she did not contact their office to inform of the swelling. Pt is not complaining of SOB, or pain,  and sounded normal on the call, However, she stated that her entire face and every gland was swollen, stated "it looked awful" pt was advised that due to facial swelling the best thing for her to do was go to the ER immediately for evaluation and treatment. Pt seemed reluctant, advice was repeated that due to facial swelling she needed to go to the ER ASAP. Pt finally agreed to go.

## 2019-12-30 NOTE — ED Triage Notes (Signed)
Pt sent here by PMD for eval , allergic reaction x 4 days

## 2019-12-30 NOTE — Telephone Encounter (Signed)
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

## 2019-12-30 NOTE — Telephone Encounter (Signed)
Agree w/ advice given 

## 2019-12-31 ENCOUNTER — Emergency Department (HOSPITAL_BASED_OUTPATIENT_CLINIC_OR_DEPARTMENT_OTHER)
Admission: EM | Admit: 2019-12-31 | Discharge: 2019-12-31 | Disposition: A | Discharge status: Home / Self Care | Payer: Managed Care, Other (non HMO) | Attending: Emergency Medicine | Admitting: Emergency Medicine

## 2019-12-31 ENCOUNTER — Encounter (HOSPITAL_BASED_OUTPATIENT_CLINIC_OR_DEPARTMENT_OTHER): Payer: Self-pay | Admitting: Emergency Medicine

## 2019-12-31 DIAGNOSIS — Z8719 Personal history of other diseases of the digestive system: Secondary | ICD-10-CM

## 2019-12-31 MED ORDER — ALUM & MAG HYDROXIDE-SIMETH 200-200-20 MG/5ML PO SUSP
30.0000 mL | Freq: Once | ORAL | Status: AC
  Administered 2019-12-31: 30 mL via ORAL
  Filled 2019-12-31: qty 30

## 2019-12-31 MED ORDER — AMOXICILLIN 500 MG PO CAPS: 500.0000 mg | ORAL_CAPSULE | Freq: Three times a day (TID) | ORAL | 0 refills | Status: DC

## 2019-12-31 MED ORDER — AMOXICILLIN 500 MG PO CAPS
500.0000 mg | ORAL_CAPSULE | Freq: Once | ORAL | Status: AC
  Administered 2019-12-31: 500 mg via ORAL
  Filled 2019-12-31: qty 1

## 2019-12-31 MED ORDER — LIDOCAINE VISCOUS HCL 2 % MT SOLN
OROMUCOSAL | Status: AC
  Administered 2019-12-31: 15 mL via ORAL
  Filled 2019-12-31: qty 15

## 2019-12-31 MED ORDER — ACETAMINOPHEN 500 MG PO TABS
1000.0000 mg | ORAL_TABLET | Freq: Once | ORAL | Status: AC
  Administered 2019-12-31: 1000 mg via ORAL
  Filled 2019-12-31: qty 2

## 2019-12-31 MED ORDER — LIDOCAINE VISCOUS HCL 2 % MT SOLN
15.0000 mL | Freq: Once | OROMUCOSAL | Status: AC
  Filled 2019-12-31: qty 15

## 2019-12-31 NOTE — ED Provider Notes (Addendum)
[facility] EMERGENCY DEPARTMENT Provider Note   CSN: [id number] Arrival date & time: 12/30/19  2308     History Chief Complaint  Patient presents with  . Allergic Reaction    Kim Edwards is a 64 y.o. female.  The history is provided by the patient.  Mouth Lesions Location:  Tongue Quality:  Ulcerous Onset quality:  Gradual Severity:  Moderate Duration:  1 week Progression:  Unchanged Chronicity:  New Context: not a change in diet   Relieved by:  Nothing Ineffective treatments:  None tried Associated symptoms: no congestion and no fever        Past Medical History:  Diagnosis Date  . Chronic back pain   . Degeneration of lumbar intervertebral disc 11/06/2017  . 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 History of cervical cancer 1988  . Hyperlipidemia   . Joint pain   . Numbness and tingling of right leg     Patient Active Problem List   Diagnosis Date Noted  . Overweight (BMI 25.0-29.9) 07/06/2019  . S/P lumbar fusion 02/26/2019  . Vitamin D deficiency 10/09/2017  . Screening for malignant neoplasm of cervix 08/24/2013  . Physical exam 08/24/2013  . Right lateral epicondylitis 08/24/2013  . Ulnar nerve compression 02/22/2012  . Lumbar radiculopathy 02/22/2012  . Abnormal LFTs 07/26/2010  . SHOULDER, PAIN 06/22/2010  . Hyperlipidemia 05/24/2010  . Anxiety and depression 05/24/2010  . PERIMENOPAUSAL SYNDROME 05/24/2010  . CERVICAL CANCER, HX OF 05/24/2010    Past Surgical History:  Procedure Laterality Date  . ABDOMINAL EXPOSURE N/A 02/26/2019   Procedure: ABDOMINAL EXPOSURE;  Surgeon: Rosetta Posner, MD;  Location: [facility];  Service: Vascular;  Laterality: N/A;  . ANTERIOR LUMBAR FUSION N/A 02/26/2019   Procedure: Anterior lumbar fusion L4-5, Right far lateral disectomy;  Surgeon: Melina Schools, MD;  Location: [facility];  Service: Orthopedics;  Laterality: N/A;  . EYE SURGERY     age 43  . HAND SURGERY       polypectomy  . LASIK  04/24/2015  . LUMBAR LAMINECTOMY/DECOMPRESSION MICRODISCECTOMY Right 02/26/2019   Procedure: LUMBAR LAMINECTOMY/DECOMPRESSION MICRODISCECTOMY L4-5 Right far lateral disectomy;  Surgeon: Melina Schools, MD;  Location: [facility] [facility];  Service: Orthopedics;  Laterality: Right;     OB History   No obstetric history on file.     Family History  Problem Relation Age of Onset  . Heart disease Mother        quad bypass  . Diabetes Mother   . Heart attack Mother   . Heart disease Father        bypass x2  . Diabetes Father   . Heart attack Father   . Hypertension Father   . Colon cancer Neg Hx     Social History   Tobacco Use  . Smoking status: Former Smoker    Quit date: 04/23/2000    Years since quitting: 19.7  . Smokeless tobacco: Never Used  Vaping Use  . Vaping Use: Never used  Substance Use Topics  . Alcohol use: Yes    Alcohol/week: 0.0 standard drinks    Comment: 1-1.5 glasses of wine daily  . Drug use: No    Home Medications Prior to Admission medications   Medication Sig Start Date End Date Taking? Authorizing Provider  fenofibrate 160 MG tablet Take 1 tablet (160 mg total) by mouth daily. 07/08/19   Midge Minium, MD  rosuvastatin (CRESTOR) 20 MG tablet Take 1 tablet (20 mg  total) by mouth daily. 07/08/19   Midge Minium, MD  traZODone (DESYREL) 50 MG tablet TAKE 1/2 TO 1 TABLET BY MOUTH EVERY NIGHT AT BEDTIME AS NEEDED FOR SLEEP 11/13/19   Midge Minium, MD    Allergies    Buspar [buspirone hcl] and Oxycodone-acetaminophen  Review of Systems   Review of Systems  Constitutional: Negative for fever.  HENT: Positive for mouth sores. Negative for congestion and facial swelling.   Eyes: Negative for visual disturbance.  Respiratory: Negative for shortness of breath.   Cardiovascular: Negative for chest pain.  Gastrointestinal: Negative for abdominal pain.  Genitourinary: Negative for difficulty urinating.  Musculoskeletal:  Negative for arthralgias.  Neurological: Negative for dizziness.  Psychiatric/Behavioral: Negative for agitation.  All other systems reviewed and are negative.   Physical Exam Updated Vital Signs BP 132/85   Pulse 80   Temp 98.4 F (36.9 C)   Resp 18   Ht 5\' 2"  (1.575 m)   Wt 64.4 kg   LMP 10/02/2010   SpO2 98%   BMI 25.97 kg/m   Physical Exam Vitals and nursing note reviewed.  Constitutional:      General: She is not in acute distress.    Appearance: Normal appearance.  HENT:     Head: Normocephalic and atraumatic.     Right Ear: Tympanic membrane normal.     Left Ear: Tympanic membrane normal.     Nose: Nose normal.     Mouth/Throat:     Mouth: Mucous membranes are moist.     Comments: 6 ulcers on the anterior tongue.  No swelling of the lips tongue or uvula.  Airway is widely patent.  Intact phonation no pain with displacement of trachea Eyes:     Extraocular Movements: Extraocular movements intact.     Pupils: Pupils are equal, round, and reactive to light.  Neck:     Comments: Shotty anterior LAN Cardiovascular:     Rate and Rhythm: Normal rate and regular rhythm.     Pulses: Normal pulses.     Heart sounds: Normal heart sounds.  Pulmonary:     Effort: Pulmonary effort is normal.     Breath sounds: Normal breath sounds. No stridor.  Abdominal:     General: Abdomen is flat. Bowel sounds are normal.     Palpations: Abdomen is soft.  Musculoskeletal:        General: Normal range of motion.     Cervical back: No rigidity.  Skin:    General: Skin is warm and dry.     Capillary Refill: Capillary refill takes less than 2 seconds.  Neurological:     General: No focal deficit present.     Mental Status: She is alert and oriented to person, place, and time.     Deep Tendon Reflexes: Reflexes normal.  Psychiatric:        Mood and Affect: Mood normal.        Behavior: Behavior normal.     ED Results / Procedures / Treatments   Labs (all labs ordered are  listed, but only abnormal results are displayed) Labs Reviewed - No data to display  EKG None  Radiology No results found.  Procedures Procedures (including critical care time)  Medications Ordered in ED Medications  acetaminophen (TYLENOL) tablet 1,000 mg (has no administration in time range)  amoxicillin (AMOXIL) capsule 500 mg (has no administration in time range)  alum & mag hydroxide-simeth (MAALOX/MYLANTA) 200-200-20 MG/5ML suspension 30 mL (has no administration in time  range)    And  lidocaine (XYLOCAINE) 2 % viscous mouth solution 15 mL (has no administration in time range)  famotidine (PEPCID) tablet 20 mg (20 mg Oral Given 12/31/19 0020)  dexamethasone (DECADRON) injection 10 mg (10 mg Intramuscular Given 12/31/19 0020)    ED Course  I have reviewed the triage vital signs and the nursing notes.  Pertinent labs & imaging results that were available during my care of the patient were reviewed by me and considered in my medical decision making (see chart for details).    Likely viral infection with reactive LAN.  But will start amoxicillin given LAN.  No signs of deep tissue infection or airway compromise.  Strict return precautions given.  Recheck with your doctor in 2 days.    Maida Widger was evaluated in Emergency Department on 12/31/2019 for the symptoms described in the history of present illness. She was evaluated in the context of the global COVID-19 pandemic, which necessitated consideration that the patient might be at risk for infection with the SARS-CoV-2 virus that causes COVID-19. Institutional protocols and algorithms that pertain to the evaluation of patients at risk for COVID-19 are in a state of rapid change based on information released by regulatory bodies including the CDC and federal and state organizations. These policies and algorithms were followed during the patient's care in the ED.  Final Clinical Impression(s) / ED Diagnoses Return for intractable  cough, coughing up blood,fevers >100.4 unrelieved by medication, shortness of breath, intractable vomiting, chest pain, shortness of breath, weakness,numbness, changes in speech, facial asymmetry,abdominal pain, passing out,Inability to tolerate liquids or food, cough, altered mental status or any concerns. No signs of systemic illness or infection. The patient is nontoxic-appearing on exam and vital signs are within normal limits.   I have reviewed the triage vital signs and the nursing notes. Pertinent labs &imaging results that were available during my care of the patient were reviewed by me and considered in my medical decision making (see chart for details).After history, exam, and medical workup I feel the patient has beenappropriately medically screened and is safe for discharge home. Pertinent diagnoses were discussed with the patient. Patient was given return precautions.   Felecia Stanfill, MD 12/31/19 [id number]    Veatrice Kells, MD 12/31/19 [id number]

## 2019-12-31 NOTE — ED Notes (Signed)
Patient discharged at [facility] during downtime.

## 2020-01-07 ENCOUNTER — Telehealth (INDEPENDENT_AMBULATORY_CARE_PROVIDER_SITE_OTHER): Payer: Managed Care, Other (non HMO) | Admitting: Family Medicine

## 2020-01-07 ENCOUNTER — Encounter: Payer: Self-pay | Admitting: Family Medicine

## 2020-01-07 ENCOUNTER — Other Ambulatory Visit: Payer: Self-pay

## 2020-01-07 DIAGNOSIS — R220 Localized swelling, mass and lump, head: Secondary | ICD-10-CM

## 2020-01-07 MED ORDER — PREDNISONE 10 MG PO TABS: ORAL_TABLET | ORAL | 0 refills | Status: DC

## 2020-01-07 NOTE — Progress Notes (Signed)
Virtual Visit via Video   I connected with patient on 01/07/20 at 11:00 AM EDT by a video enabled telemedicine application and verified that I am speaking with the correct person using two identifiers.  Location patient: Home Location provider: [facility], Office Persons participating in the virtual visit: Patient, Provider, [place] (Jess B)  I discussed the limitations of evaluation and management by telemedicine and the availability of in person appointments. The patient expressed understanding and agreed to proceed.  Subjective:   HPI:   ER f/u- pt was seen in ER 9/9 for concerns of facial swelling.  Per ER note, visit centered around oral ulcers.  She was given Amoxicillin due to cervical LAD despite being told this was a likely viral illness.  Got a steroid shot in ER.  Today pt remains concerned about facial swelling.  She reports facial swelling has been present x2 weeks.  Pt reports discomfort but not pain.  No fevers or chills.  Pt feels better when she is up and moving  ROS:   See pertinent positives and negatives per HPI.  Patient Active Problem List   Diagnosis Date Noted  . Overweight (BMI 25.0-29.9) 07/06/2019  . S/P lumbar fusion 02/26/2019  . Vitamin D deficiency 10/09/2017  . Screening for malignant neoplasm of cervix 08/24/2013  . Physical exam 08/24/2013  . Right lateral epicondylitis 08/24/2013  . Ulnar nerve compression 02/22/2012  . Lumbar radiculopathy 02/22/2012  . Abnormal LFTs 07/26/2010  . SHOULDER, PAIN 06/22/2010  . Hyperlipidemia 05/24/2010  . Anxiety and depression 05/24/2010  . PERIMENOPAUSAL SYNDROME 05/24/2010  . CERVICAL CANCER, HX OF 05/24/2010    Social History   Tobacco Use  . Smoking status: Former Smoker    Quit date: 04/23/2000    Years since quitting: 19.7  . Smokeless tobacco: Never Used  Substance Use Topics  . Alcohol use: Yes    Alcohol/week: 0.0 standard drinks    Comment: 1-1.5 glasses of wine daily    Current  Outpatient Medications:  .  fenofibrate 160 MG tablet, Take 1 tablet (160 mg total) by mouth daily., Disp: 90 tablet, Rfl: 1 .  rosuvastatin (CRESTOR) 20 MG tablet, Take 1 tablet (20 mg total) by mouth daily., Disp: 90 tablet, Rfl: 1 .  traZODone (DESYREL) 50 MG tablet, TAKE 1/2 TO 1 TABLET BY MOUTH EVERY NIGHT AT BEDTIME AS NEEDED FOR SLEEP, Disp: 30 tablet, Rfl: 0 .  amoxicillin (AMOXIL) 500 MG capsule, Take 1 capsule (500 mg total) by mouth 3 (three) times daily. (Patient not taking: Reported on 01/07/2020), Disp: 21 capsule, Rfl: 0  Allergies  Allergen Reactions  . Buspar [Buspirone Hcl] Other (See Comments)    Joints hurt   . Oxycodone-Acetaminophen Other (See Comments)    upset stomach    Objective:   LMP 10/02/2010  AAOx3, NAD Marked facial swelling- particularly periorbital and both cheeks.  No lip or tongue swelling. + neck swelling No obvious CN deficits Coloring WNL Pt is able to speak clearly, coherently without shortness of breath or increased work of breathing.  Thought process is linear.  Mood is appropriate.    Assessment and Plan:   Facial swelling- new.  Pt has marked swelling on exam today.  Her swelling does not appear consistent w/ angioedema (no swelling of lips, tongue, uvula) and is more concerning for SVC compression syndrome or mediastinal abnormality.  Will start w/ CXR with the understanding we may need to proceed to CT scan.  Will start Prednisone taper to improve  symptoms.  Reviewed supportive care and red flags that should prompt return.  Pt expressed understanding and is in agreement w/ plan.    Annye Asa, MD 01/07/2020

## 2020-01-07 NOTE — Progress Notes (Signed)
I have discussed the procedure for the virtual visit with the patient who has given consent to proceed with assessment and treatment.   Pt unable to obtain vitals.   Kim Edwards L Trinadee Verhagen, CMA     

## 2020-01-13 ENCOUNTER — Other Ambulatory Visit: Payer: Self-pay

## 2020-01-13 ENCOUNTER — Ambulatory Visit (HOSPITAL_BASED_OUTPATIENT_CLINIC_OR_DEPARTMENT_OTHER)
Admission: RE | Admit: 2020-01-13 | Discharge: 2020-01-13 | Disposition: A | Discharge status: Home / Self Care | Payer: Managed Care, Other (non HMO) | Source: Ambulatory Visit | Attending: Family Medicine | Admitting: Family Medicine

## 2020-01-13 DIAGNOSIS — R220 Localized swelling, mass and lump, head: Secondary | ICD-10-CM | POA: Diagnosis present

## 2020-01-14 ENCOUNTER — Telehealth: Payer: Self-pay | Admitting: Family Medicine

## 2020-01-14 NOTE — Telephone Encounter (Signed)
Patient stated she was going to try to take a covid test today if not tomorrow

## 2020-01-14 NOTE — Telephone Encounter (Signed)
Please advise 

## 2020-01-14 NOTE — Telephone Encounter (Signed)
Patient states her face swelling is not getting any better. Its been about 4 weeks since swelling started, patient states she was told to call and inform us swelling was not improving. Please advise.

## 2020-01-14 NOTE — Telephone Encounter (Signed)
If pt is having flu like sxs I strongly encourage her to get a COVID test

## 2020-01-14 NOTE — Telephone Encounter (Signed)
Patient is scheduled for 1 pm virtually 9/24 virtually due to her having flu like symptoms

## 2020-01-14 NOTE — Telephone Encounter (Signed)
FYI

## 2020-01-15 ENCOUNTER — Telehealth (INDEPENDENT_AMBULATORY_CARE_PROVIDER_SITE_OTHER): Payer: Managed Care, Other (non HMO) | Admitting: Family Medicine

## 2020-01-15 ENCOUNTER — Other Ambulatory Visit: Payer: Self-pay

## 2020-01-15 ENCOUNTER — Encounter: Payer: Self-pay | Admitting: Family Medicine

## 2020-01-15 VITALS — Ht 62.0 in | Wt 142.0 lb

## 2020-01-15 DIAGNOSIS — I889 Nonspecific lymphadenitis, unspecified: Secondary | ICD-10-CM

## 2020-01-15 DIAGNOSIS — R220 Localized swelling, mass and lump, head: Secondary | ICD-10-CM

## 2020-01-15 MED ORDER — AMOXICILLIN-POT CLAVULANATE 875-125 MG PO TABS: 1.0000 | ORAL_TABLET | Freq: Two times a day (BID) | ORAL | 0 refills | Status: DC

## 2020-01-15 NOTE — Progress Notes (Signed)
Patient: Kim Edwards MRN: [id number] DOB: 01-22-1956 PCP: Midge Minium, MD     I connected with Lendon Collar on 01/15/20 at @CHLAPPTIME @ by a video enabled telemedicine application and verified that I am speaking with the correct person using two identifiers.  Location patient: Home Location provider: [facility], Office Persons participating in this virtual visit: pt, CMA (Mellita C), Alver Fisher, MD  I discussed the limitations of evaluation and management by telemedicine and the availability of in person appointments. The patient expressed understanding and agreed to proceed.   Subjective:  No chief complaint on file.   HPI: The patient is a 64 y.o. female who presents today for facial swelling. She says that she has been sick for 4 weeks. She stated having Covid symptoms. She complains of fatigue and low energy. She has taken ibuprofen.   Yesterday went to ER b/c she was so fatigued and was running a fever.  Strep, COVID, monospot were all negative. CXR WNL.  She was told that all cervical- anterior and posterior- LNs were swollen.  TTP.  ER doc said facial swelling wasn't actually facial swelling it was due to enlarged LNs.  Face has been swollen x4 weeks.  No rashes.  No new animal exposures  Review of Systems  Allergies Patient is allergic to buspar [buspirone hcl] and oxycodone-acetaminophen.  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  Surgical History Patient  has a past surgical history that includes Eye surgery; Hand surgery; LASIK (04/24/2015); Anterior lumbar fusion (N/A, 02/26/2019); Lumbar laminectomy/decompression microdiscectomy (Right, 02/26/2019); and Abdominal exposure (N/A,  02/26/2019).  Family History Pateint's family history includes Diabetes in her father and mother; Heart attack in her father and mother; Heart disease in her father and mother; Hypertension in her father.  Social History Patient  reports that she quit smoking about 19 years ago. She has never used smokeless tobacco. She reports current alcohol use. She reports that she does not use drugs.    Objective: There were no vitals filed for this visit.  There is no height or weight on file to calculate BMI.  Physical Exam AAOx3, NAD NCAT, EOMI Obvious facial/neck swelling No obvious CN deficits Coloring WNL Pt is able to speak clearly, coherently without shortness of breath or increased work of breathing.  Thought process is linear.  Mood is appropriate.     Assessment/plan:  Lymphadenitis- deteriorated.  Yesterday developed fever and severe fatigue and went to ER for evaluation.  Negative for COVID, Strep, mono.  CBC unremarkable.  CXR WNL.  At this time, will start Augmentin for possible bacterial lymphadenitis and will get EBV/CMV titers.  Will also refer to hematology given the impressive anterior/posterior cervical LAD.  Pt expressed understanding and is in agreement w/ plan.    No follow-ups on file.    Tobe Sos, MD [place]  01/15/2020

## 2020-01-19 ENCOUNTER — Encounter: Payer: Self-pay | Admitting: *Deleted

## 2020-01-19 NOTE — Progress Notes (Signed)
Reached out to Lendon Collar to introduce myself as the office RN Navigator and explain our new patient process. Reviewed the reason for their referral and scheduled their new patient appointment along with labs. Provided address and directions to the office including call back phone number. Reviewed with patient any concerns they may have or any possible barriers to attending their appointment.

## 2020-01-22 ENCOUNTER — Inpatient Hospital Stay: Payer: Managed Care, Other (non HMO) | Attending: Hematology & Oncology

## 2020-01-22 ENCOUNTER — Other Ambulatory Visit: Payer: Self-pay

## 2020-01-22 ENCOUNTER — Encounter: Payer: Self-pay | Admitting: *Deleted

## 2020-01-22 ENCOUNTER — Ambulatory Visit: Payer: Managed Care, Other (non HMO)

## 2020-01-22 ENCOUNTER — Inpatient Hospital Stay (HOSPITAL_BASED_OUTPATIENT_CLINIC_OR_DEPARTMENT_OTHER): Payer: Managed Care, Other (non HMO) | Admitting: Hematology & Oncology

## 2020-01-22 ENCOUNTER — Encounter: Payer: Self-pay | Admitting: Hematology & Oncology

## 2020-01-22 ENCOUNTER — Inpatient Hospital Stay: Payer: Managed Care, Other (non HMO)

## 2020-01-22 VITALS — BP 121/48 | HR 97 | Temp 99.6°F | Resp 18 | Wt 138.0 lb

## 2020-01-22 DIAGNOSIS — R221 Localized swelling, mass and lump, neck: Secondary | ICD-10-CM | POA: Diagnosis not present

## 2020-01-22 DIAGNOSIS — R7989 Other specified abnormal findings of blood chemistry: Secondary | ICD-10-CM

## 2020-01-22 DIAGNOSIS — R531 Weakness: Secondary | ICD-10-CM | POA: Insufficient documentation

## 2020-01-22 DIAGNOSIS — F419 Anxiety disorder, unspecified: Secondary | ICD-10-CM

## 2020-01-22 DIAGNOSIS — R220 Localized swelling, mass and lump, head: Secondary | ICD-10-CM

## 2020-01-22 DIAGNOSIS — I2699 Other pulmonary embolism without acute cor pulmonale: Secondary | ICD-10-CM | POA: Diagnosis not present

## 2020-01-22 DIAGNOSIS — Z8541 Personal history of malignant neoplasm of cervix uteri: Secondary | ICD-10-CM | POA: Diagnosis not present

## 2020-01-22 DIAGNOSIS — F32A Depression, unspecified: Secondary | ICD-10-CM

## 2020-01-22 DIAGNOSIS — R945 Abnormal results of liver function studies: Secondary | ICD-10-CM

## 2020-01-22 DIAGNOSIS — Z87891 Personal history of nicotine dependence: Secondary | ICD-10-CM | POA: Insufficient documentation

## 2020-01-22 LAB — CBC WITH DIFFERENTIAL (CANCER CENTER ONLY)
Abs Immature Granulocytes: 0.11 10*3/uL — ABNORMAL HIGH (ref 0.00–0.07)
Basophils Absolute: 0 10*3/uL (ref 0.0–0.1)
Basophils Relative: 1 %
Eosinophils Absolute: 0 10*3/uL (ref 0.0–0.5)
Eosinophils Relative: 1 %
HCT: 37.3 % (ref 36.0–46.0)
Hemoglobin: 12.3 g/dL (ref 12.0–15.0)
Immature Granulocytes: 2 %
Lymphocytes Relative: 14 %
Lymphs Abs: 0.9 10*3/uL (ref 0.7–4.0)
MCH: 32.4 pg (ref 26.0–34.0)
MCHC: 33 g/dL (ref 30.0–36.0)
MCV: 98.2 fL (ref 80.0–100.0)
Monocytes Absolute: 0.3 10*3/uL (ref 0.1–1.0)
Monocytes Relative: 5 %
Neutro Abs: 4.9 10*3/uL (ref 1.7–7.7)
Neutrophils Relative %: 77 %
Platelet Count: 275 10*3/uL (ref 150–400)
RBC: 3.8 MIL/uL — ABNORMAL LOW (ref 3.87–5.11)
RDW: 12.8 % (ref 11.5–15.5)
WBC Count: 6.2 10*3/uL (ref 4.0–10.5)
nRBC: 0 % (ref 0.0–0.2)

## 2020-01-22 LAB — CMP (CANCER CENTER ONLY)
ALT: 14 U/L (ref 0–44)
AST: 14 U/L — ABNORMAL LOW (ref 15–41)
Albumin: 3.7 g/dL (ref 3.5–5.0)
Alkaline Phosphatase: 26 U/L — ABNORMAL LOW (ref 38–126)
Anion gap: 10 (ref 5–15)
BUN: 10 mg/dL (ref 8–23)
CO2: 27 mmol/L (ref 22–32)
Calcium: 9.4 mg/dL (ref 8.9–10.3)
Chloride: 99 mmol/L (ref 98–111)
Creatinine: 0.72 mg/dL (ref 0.44–1.00)
GFR, Est AFR Am: >60 mL/min (ref >60)
GFR, Estimated: >60 mL/min (ref >60)
Glucose, Bld: 208 mg/dL — ABNORMAL HIGH (ref 70–99)
Potassium: 3.6 mmol/L (ref 3.5–5.1)
Sodium: 136 mmol/L (ref 135–145)
Total Bilirubin: 0.5 mg/dL (ref 0.3–1.2)
Total Protein: 6.5 g/dL (ref 6.5–8.1)

## 2020-01-22 LAB — LACTATE DEHYDROGENASE: LDH: 239 U/L — ABNORMAL HIGH (ref 98–192)

## 2020-01-22 LAB — SEDIMENTATION RATE: Sed Rate: 83 mm/hr — ABNORMAL HIGH (ref 0–22)

## 2020-01-22 LAB — SAVE SMEAR(SSMR), FOR PROVIDER SLIDE REVIEW

## 2020-01-22 NOTE — Progress Notes (Addendum)
Referral MD  Reason for Referral: Facial swelling/neck swelling/weakness  Chief Complaint  Patient presents with  . New Patient (Initial Visit)  : I have been swelling in my face and neck for about a month.  HPI: Kim Edwards is a very nice 64 year old white female.  She is originally from [place].   I really had a good time talking about [place] since I did some of my medical training up at  [place].  She has lived all over the country.  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  She has a partner and 3 children.  Her partner also works for [employer].  Kim Edwards has had back surgery.  She had this a couple years ago.  She had a fusion.  She really has had no other surgeries..  She does not smoke.  She may have a alcoholic drink every now and then.  She has had facial swelling for about a month.  She has had no travel.  She was not on any medications outside of cholesterol medicines.  She saw her family doctor, Dr. Birdie Riddle, who has done a work-up which really has not been remarkable.  Dr. Birdie Riddle kindly referred Kim Edwards to the [employer] for an evaluation.  There has been no obvious adenopathy.  She has been on steroids on and off.  She has had some steroid injections.  She does not have any obvious rheumatoid arthritis or any collagen vascular issues.  There is no history of anemia in the family.  She has had some weight loss.  Her appetite is okay.  She has not a vegetarian.  Is been no rashes.  She has had no fever.  She has had no exposure to the coronavirus as far she knows.   She has felt quite tired.  She does not have a lot of energy.  Her last TSH was checked back in June 2020.  This was okay.  Her blood sugar is up quite a bit today.  This might be from her being recently on steroids.  Overall, her performance status is ECOG 0.   Past Medical History:  Diagnosis Date  . Chronic back pain   . Degeneration of lumbar  intervertebral disc 11/06/2017  . 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 History of cervical cancer 1988  . Hyperlipidemia   . Joint pain   . Numbness and tingling of right leg   :  Past Surgical History:  Procedure Laterality Date  . ABDOMINAL EXPOSURE N/A 02/26/2019   Procedure: ABDOMINAL EXPOSURE;  Surgeon: Rosetta Posner, MD;  Location: [facility] OR;  Service: Vascular;  Laterality: N/A;  . ANTERIOR LUMBAR FUSION N/A 02/26/2019   Procedure: Anterior lumbar fusion L4-5, Right far lateral disectomy;  Surgeon: Melina Schools, MD;  Location: [facility];  Service: Orthopedics;  Laterality: N/A;  . EYE SURGERY     age 43  . HAND SURGERY     polypectomy  . LASIK  04/24/2015  . LUMBAR LAMINECTOMY/DECOMPRESSION MICRODISCECTOMY Right 02/26/2019   Procedure: LUMBAR LAMINECTOMY/DECOMPRESSION MICRODISCECTOMY L4-5 Right far lateral disectomy;  Surgeon: Melina Schools, MD;  Location: [facility];  Service: Orthopedics;  Laterality: Right;  :   Current Outpatient Medications:  .  amoxicillin-clavulanate (AUGMENTIN) 875-125 MG tablet, Take 1 tablet by mouth 2 (two) times daily., Disp: 20 tablet, Rfl: 0 .  cyclobenzaprine (FLEXERIL) 10 MG tablet, Take 10 mg by mouth 2 (two) times daily as needed.,  Disp: , Rfl:  .  fenofibrate 160 MG tablet, Take 1 tablet (160 mg total) by mouth daily. (Patient not taking: Reported on 01/15/2020), Disp: 90 tablet, Rfl: 1 .  HYDROcodone-acetaminophen (NORCO/VICODIN) 5-325 MG tablet, Take 1 tablet by mouth every 6 (six) hours as needed., Disp: , Rfl:  .  ondansetron (ZOFRAN-ODT) 4 MG disintegrating tablet, Take 4 mg by mouth every 6 (six) hours as needed., Disp: , Rfl:  .  rosuvastatin (CRESTOR) 20 MG tablet, Take 1 tablet (20 mg total) by mouth daily. (Patient not taking: Reported on 01/15/2020), Disp: 90 tablet, Rfl: 1 .  traZODone (DESYREL) 50 MG tablet, TAKE 1/2 TO 1 TABLET BY MOUTH EVERY NIGHT AT BEDTIME AS NEEDED FOR SLEEP (Patient not taking:  Reported on 01/15/2020), Disp: 30 tablet, Rfl: 0:  :  Allergies  Allergen Reactions  . Buspar [Buspirone Hcl] Other (See Comments)    Joints hurt   . Oxycodone-Acetaminophen Other (See Comments)    upset stomach  :  Family History  Problem Relation Age of Onset  . Heart disease Mother        quad bypass  . Diabetes Mother   . Heart attack Mother   . Heart disease Father        bypass x2  . Diabetes Father   . Heart attack Father   . Hypertension Father   . Colon cancer Neg Hx   :  Social History   Socioeconomic History  . Marital status: Single    Spouse name: Not on file  . Number of children: Not on file  . Years of education: Not on file  . Highest education level: Not on file  Occupational History  . Not on file  Tobacco Use  . Smoking status: Former Smoker    Quit date: 04/23/2000    Years since quitting: 19.7  . Smokeless tobacco: Never Used  Vaping Use  . Vaping Use: Never used  Substance and Sexual Activity  . Alcohol use: Yes    Alcohol/week: 0.0 standard drinks    Comment: 1-1.5 glasses of wine daily  . Drug use: No  . Sexual activity: Not on file  Other Topics Concern  . Not on file  Social History Narrative  . Not on file   Social Determinants of Health   Financial Resource Strain:   . Difficulty of Paying Living Expenses: Not on file  Food Insecurity:   . Worried About Charity fundraiser in the Last Year: Not on file  . Ran Out of Food in the Last Year: Not on file  Transportation Needs:   . Lack of Transportation (Medical): Not on file  . Lack of Transportation (Non-Medical): Not on file  Physical Activity:   . Days of Exercise per Week: Not on file  . Minutes of Exercise per Session: Not on file  Stress:   . Feeling of Stress : Not on file  Social Connections:   . Frequency of Communication with Friends and Family: Not on file  . Frequency of Social Gatherings with Friends and Family: Not on file  . Attends Religious Services: Not  on file  . Active Member of Clubs or Organizations: Not on file  . Attends Archivist Meetings: Not on file  . Marital Status: Not on file  Intimate Partner Violence:   . Fear of Current or Ex-Partner: Not on file  . Emotionally Abused: Not on file  . Physically Abused: Not on file  . Sexually Abused: Not  on file  :  Review of Systems  Constitutional: Positive for malaise/fatigue.  HENT: Negative.   Eyes: Negative.   Respiratory: Negative.   Cardiovascular: Negative.   Gastrointestinal: Negative.   Genitourinary: Negative.   Musculoskeletal: Negative.   Skin: Negative.   Neurological: Negative.   Endo/Heme/Allergies: Negative.   Psychiatric/Behavioral: Negative.      Exam:  This is a well-developed and well-nourished white female in no obvious distress.  Vital signs show temperature of 99.  Pulse 85.  Blood pressure 121/48.  Weight is 138 pounds.  Head and neck exam shows no ocular or oral lesions.  She does have some swelling which appears to be symmetric on the cheeks.  She has no mucositis in the mouth.  There is no palpable adenopathy in the cervical or supraclavicular nodes.  Thyroid is not palpable.  Lungs are clear bilaterally.  Cardiac exam regular rate and rhythm with no murmurs, rubs or bruits.  Abdomen is soft.  She has good bowel sounds.  She has no fluid wave.  There is a laparotomy scar from her actual back surgery.  There is no palpable liver or spleen tip.  Back exam shows a laminectomy scar in the lower back.  This is well-healed.  There is no tenderness over the spine, ribs or hips.  Extremities shows no clubbing, cyanosis or edema.  She does not have any obvious osteoarthritic changes.  She has decent range of motion of her joints.  Neurological exam shows no focal neurological deficits.  Skin exam shows no rashes, ecchymoses or petechia.   '@IPVITALS' @   Recent Labs    01/22/20 1039  WBC 6.2  HGB 12.3  HCT 37.3  PLT 275   Recent Labs     01/22/20 1039  NA 136  K 3.6  CL 99  CO2 27  GLUCOSE 208*  BUN 10  CREATININE 0.72  CALCIUM 9.4    Blood smear review: Normochromic and normocytic population of red blood cells.  I see no nucleated red blood cells.  There are no schistocytes or spherocytes.  I see no rouleaux formation.  She has no anisocytosis or poikilocytosis.  White blood cells appear normal in morphology maturation.  She has no hypersegmented polys.  There is no immature myeloid or lymphoid forms.  Platelets are adequate number and size.  Platelets are well granulated.  Pathology: None    Assessment and Plan: Kim Edwards is a very nice 64 year old white female.  She has facial swelling.  I am not sure as to what really could be going on.  I do think that a CT scan probably would not be a bad idea.  I would be surprised if we find anything related to malignancy.    Some the swelling could be from her being on steroids.  I do not think that she has angioedema.  Hypothyroidism is always a possibility.  We will see what her TSH is.  Collagen vascular disease is possible.  Again this would be a little unusual but it be worthwhile checking.  I do not see any indication for a bone marrow biopsy on her.  Again, I would be surprised if we found anything malignant.  I do not see anything that is hematologic that would cause the problem.  I suppose one could always check her for EBV to see if she has maybe chronic fatigue syndrome.  I spent about an hour with her.  She is very nice.  I just feel bad that she  is having problems and not feeling well.  We will certainly do our part to try to find out if there is any problem that we need to manage.  ADDENDUM: I was called about her CT scan.  Shockingly, she does have pulmonary emboli.  I am surprised by this since she has no pulmonary symptoms.  There is no obvious malignancy.  We will try to get her back in.  We have called her many times.  We cannot get a hold of her.   I will go ahead and call in Xarelto for her.  We will still try to see about getting her in to see [place].  I think we have to talk about what we found.  Lattie Haw, MD

## 2020-01-22 NOTE — Progress Notes (Signed)
Initial RN Navigator Patient Visit  Name: Kim Edwards Date of Referral : 01/18/20 Diagnosis: Facial Swelling  At this time, patient is low suspicion for malignancy. Met with patient prior to their visit with MD. Hanley Seamen patient my card and explained my role in her care team.   Patient completed visit with Dr. Marin Olp  She will need CT scans which are scheduled for 01/27/2020.   Will continue to follow patient until malignancy is ruled in/out. Patient knows to call me with any questions or concerns.   Oncology Nurse Navigator Documentation  Oncology Nurse Navigator Flowsheets 01/22/2020  Abnormal Finding Date -  Diagnosis Status -  Navigator Follow Up Date: 01/27/2020  Navigator Follow Up Reason: Radiology  Navigator Location [facility]  Referral Date to RadOnc/MedOnc -  Navigator Encounter Type Initial MedOnc  Patient Visit Type MedOnc  Treatment Phase Abnormal Scans  Barriers/Navigation Needs Coordination of Care;Education  Education Other  Interventions Coordination of Care;Psycho-Social Support  Acuity Level 2-Minimal Needs (1-2 Barriers Identified)  Coordination of Care Radiology  Education Method Verbal  Support Groups/Services Friends and Family  Time Spent with Patient 30

## 2020-01-23 LAB — BETA 2 MICROGLOBULIN, SERUM: Beta-2 Microglobulin: 2.1 mg/L (ref 0.6–2.4)

## 2020-01-23 LAB — RHEUMATOID FACTOR: Rheumatoid fact SerPl-aCnc: 10.3 IU/mL (ref 0.0–13.9)

## 2020-01-23 LAB — ANGIOTENSIN CONVERTING ENZYME: Angiotensin-Converting Enzyme: 40 U/L (ref 14–82)

## 2020-01-25 ENCOUNTER — Telehealth: Payer: Self-pay | Admitting: Hematology & Oncology

## 2020-01-25 LAB — PORPHYRINS, FRACTIONATION-PLASMA

## 2020-01-25 LAB — TSH: TSH: 1.132 u[IU]/mL (ref 0.308–3.960)

## 2020-01-25 LAB — ANTINUCLEAR ANTIBODIES, IFA: ANA Ab, IFA: NEGATIVE

## 2020-01-25 NOTE — Telephone Encounter (Signed)
No los 10/1 °

## 2020-01-26 LAB — VISCOSITY, SERUM: Viscosity, Serum: 1.7 rel.saline (ref 1.4–2.1)

## 2020-01-27 ENCOUNTER — Other Ambulatory Visit: Payer: Self-pay | Admitting: Hematology & Oncology

## 2020-01-27 ENCOUNTER — Other Ambulatory Visit: Payer: Self-pay

## 2020-01-27 ENCOUNTER — Encounter: Payer: Self-pay | Admitting: *Deleted

## 2020-01-27 ENCOUNTER — Other Ambulatory Visit (HOSPITAL_BASED_OUTPATIENT_CLINIC_OR_DEPARTMENT_OTHER): Payer: Managed Care, Other (non HMO)

## 2020-01-27 ENCOUNTER — Ambulatory Visit (INDEPENDENT_AMBULATORY_CARE_PROVIDER_SITE_OTHER): Payer: Managed Care, Other (non HMO)

## 2020-01-27 DIAGNOSIS — Z8541 Personal history of malignant neoplasm of cervix uteri: Secondary | ICD-10-CM

## 2020-01-27 DIAGNOSIS — R7989 Other specified abnormal findings of blood chemistry: Secondary | ICD-10-CM

## 2020-01-27 DIAGNOSIS — I2699 Other pulmonary embolism without acute cor pulmonale: Secondary | ICD-10-CM | POA: Diagnosis not present

## 2020-01-27 DIAGNOSIS — R221 Localized swelling, mass and lump, neck: Secondary | ICD-10-CM

## 2020-01-27 DIAGNOSIS — D259 Leiomyoma of uterus, unspecified: Secondary | ICD-10-CM | POA: Diagnosis not present

## 2020-01-27 MED ORDER — RIVAROXABAN (XARELTO) VTE STARTER PACK (15 & 20 MG): ORAL_TABLET | ORAL | 0 refills | Status: DC

## 2020-01-27 MED ORDER — IOHEXOL 300 MG/ML  SOLN
100.0000 mL | Freq: Once | INTRAMUSCULAR | Status: AC | PRN
  Administered 2020-01-27: 100 mL via INTRAVENOUS

## 2020-01-27 NOTE — Progress Notes (Signed)
Patient had a CT today which showed a PE. Dr Marin Olp wants patient to be seen in the office this afternoon to begin treatment.  Started calling patient at 1230 without answer. Message left on personal voicemail. Called patient approximately every 45 minutes unable to get a response. At 2:30p I called her contact, Sharyn Lull and was also unable to reach her. Message left on her voice mail as well.   Notified Dr Marin Olp that I was unable to reach patient at 4:30p. He will send in prescription. Will call patient and leave a more detailed message and request she call our office first thing tomorrow morning.   Oncology Nurse Navigator Documentation  Oncology Nurse Navigator Flowsheets 01/27/2020  Abnormal Finding Date -  Diagnosis Status -  Navigator Follow Up Date: -  Navigator Follow Up Reason: -  Navigator Location [facility]  Referral Date to RadOnc/MedOnc -  Navigator Encounter Type Telephone  Telephone Outgoing Call;Patient Update;Appt Confirmation/Clarification  Patient Visit Type MedOnc  Treatment Phase Abnormal Scans  Barriers/Navigation Needs Coordination of Care;Education  Education -  Interventions Coordination of Care  Acuity Level 2-Minimal Needs (1-2 Barriers Identified)  Coordination of Care Appts  Education Method -  Support Groups/Services -  Time Spent with Patient 60

## 2020-01-27 NOTE — Addendum Note (Signed)
Addended by: Burney Gauze R on: 01/27/2020 04:48 PM   Modules accepted: Orders

## 2020-01-28 ENCOUNTER — Ambulatory Visit: Payer: Managed Care, Other (non HMO) | Admitting: Family

## 2020-01-28 ENCOUNTER — Telehealth: Payer: Self-pay | Admitting: Hematology & Oncology

## 2020-01-28 ENCOUNTER — Encounter: Payer: Self-pay | Admitting: *Deleted

## 2020-01-28 NOTE — Telephone Encounter (Signed)
Called and LMVM for patient regarding appointment added for 10/7 per verbal request from [place].  Ok for her to see Judson Roch per [employer].

## 2020-01-28 NOTE — Progress Notes (Addendum)
After several more attempts this morning to reach patient, I was finally able to make contact at 1:05p. Patient states her phone wasn't 'acting right' and she didn't receive any of my prior voicemails.   I reviewed CT findings with patient and told her she needed to start on the Xarelto called in ASAP. I also asked her to come into the office to be seen today. She states she is too busy at work and can't come in until Tuesday. I once again, tried to educate her to the severity of this potential finding, but she said she couldn't come before Tuesday. She will pick up her medication today and start.   Information regarding the call sent to Dr Marin Olp.  Oncology Nurse Navigator Documentation  Oncology Nurse Navigator Flowsheets 01/28/2020  Abnormal Finding Date -  Diagnosis Status -  Navigator Follow Up Date: 01/29/2020  Navigator Follow Up Reason: Appointment Review  Navigator Location [facility]  Referral Date to RadOnc/MedOnc -  Navigator Encounter Type Telephone  Telephone Outgoing Call;Patient Update;Asess Navigation Needs  Patient Visit Type MedOnc  Treatment Phase Abnormal Scans  Barriers/Navigation Needs Coordination of Care;Education  Education Other  Interventions Coordination of Care  Acuity Level 2-Minimal Needs (1-2 Barriers Identified)  Coordination of Care Appts  Education Method Verbal  Support Groups/Services Friends and Family  Time Spent with Patient 61

## 2020-02-01 ENCOUNTER — Other Ambulatory Visit: Payer: Self-pay | Admitting: Family

## 2020-02-01 ENCOUNTER — Other Ambulatory Visit: Payer: Self-pay | Admitting: *Deleted

## 2020-02-01 ENCOUNTER — Encounter: Payer: Self-pay | Admitting: *Deleted

## 2020-02-01 DIAGNOSIS — D684 Acquired coagulation factor deficiency: Secondary | ICD-10-CM

## 2020-02-01 DIAGNOSIS — D6859 Other primary thrombophilia: Secondary | ICD-10-CM

## 2020-02-01 DIAGNOSIS — L574 Cutis laxa senilis: Secondary | ICD-10-CM

## 2020-02-01 DIAGNOSIS — I2699 Other pulmonary embolism without acute cor pulmonale: Secondary | ICD-10-CM

## 2020-02-01 MED ORDER — RIVAROXABAN (XARELTO) VTE STARTER PACK (15 & 20 MG): ORAL_TABLET | ORAL | 0 refills | Status: DC

## 2020-02-01 MED FILL — XARELTO STARTER PACK: 15 & 20 | 28 days supply | Qty: 51 | Fill #0

## 2020-02-01 NOTE — Progress Notes (Signed)
Received a call from patient that she still had been unable to start her Xarelto. It has been unabavailable from her pharmacy.  Called the [facility] pharmacy and they stated they did not have it in stock and it should arrive today. Checked the copay amount which they stated would be greater than $200.   Called our pharmacy downstairs. They stated they had the medication in stock, and they would be able to fill the first fill for $0. Prescription sent downstairs.  Called patient and notified her of pharmacy change and copay issue. She stated this was out of her ability to pay. She knows to pick up first fill downstairs and that we will reach out about copay assistance.  Message sent to [facility] requesting she reach out to the patient regarding how she can apply for financial aid.   Oncology Nurse Navigator Documentation  Oncology Nurse Navigator Flowsheets 02/01/2020  Abnormal Finding Date -  Diagnosis Status -  Navigator Follow Up Date: 02/02/2020  Navigator Follow Up Reason: Follow-up Appointment  Navigator Location [facility]  Referral Date to RadOnc/MedOnc -  Navigator Encounter Type Telephone  Telephone Medication Assistance;Outgoing Call  Patient Visit Type MedOnc  Treatment Phase Other  Barriers/Navigation Needs Financial Toxicity  Education -  Interventions Coordination of Care;Medication Assistance;Referrals  Acuity Level 2-Minimal Needs (1-2 Barriers Identified)  Referrals Financial Counseling  Coordination of Care Other  Education Method -  Support Groups/Services Friends and Family  Time Spent with Patient 53

## 2020-02-02 ENCOUNTER — Inpatient Hospital Stay (HOSPITAL_BASED_OUTPATIENT_CLINIC_OR_DEPARTMENT_OTHER): Payer: Managed Care, Other (non HMO) | Admitting: Family

## 2020-02-02 ENCOUNTER — Telehealth: Payer: Self-pay | Admitting: Family

## 2020-02-02 ENCOUNTER — Other Ambulatory Visit: Payer: Self-pay

## 2020-02-02 ENCOUNTER — Encounter: Payer: Self-pay | Admitting: *Deleted

## 2020-02-02 ENCOUNTER — Inpatient Hospital Stay: Payer: Managed Care, Other (non HMO)

## 2020-02-02 ENCOUNTER — Encounter: Payer: Self-pay | Admitting: Family

## 2020-02-02 VITALS — BP 126/71 | HR 82 | Temp 98.4°F | Resp 18 | Ht 62.0 in | Wt 142.1 lb

## 2020-02-02 DIAGNOSIS — I2602 Saddle embolus of pulmonary artery with acute cor pulmonale: Secondary | ICD-10-CM | POA: Diagnosis not present

## 2020-02-02 DIAGNOSIS — D6859 Other primary thrombophilia: Secondary | ICD-10-CM

## 2020-02-02 DIAGNOSIS — R220 Localized swelling, mass and lump, head: Secondary | ICD-10-CM | POA: Diagnosis not present

## 2020-02-02 DIAGNOSIS — I2690 Septic pulmonary embolism without acute cor pulmonale: Secondary | ICD-10-CM | POA: Diagnosis not present

## 2020-02-02 LAB — D-DIMER, QUANTITATIVE: D-Dimer, Quant: 0.38 ug/mL-FEU (ref 0.00–0.50)

## 2020-02-02 LAB — ANTITHROMBIN III: AntiThromb III Func: 108 % (ref 75–120)

## 2020-02-02 NOTE — Progress Notes (Signed)
Spoke to the patient and she started her Xarelto yesterday. She was able to get the prescription for free for her first fill. She understands that her next fill may be for the higher co-pay which she already knows she cannot afford. She states she is working with the pharmacy for copay assistance. Also encouraged her to talk to Baxter Flattery, financial specialist, while here in the office to see what options she had available. She agreed.  Since work up didn't reveal a malignancy, I will sign off on active navigation.  Oncology Nurse Navigator Documentation  Oncology Nurse Navigator Flowsheets 02/02/2020  Abnormal Finding Date -  Diagnosis Status -  Navigator Follow Up Date: -  Navigator Follow Up Reason: -  Navigation Complete Date: 02/02/2020  Post Navigation: Continue to Follow Patient? No  Reason Not Navigating Patient: No Cancer Diagnosis  Navigator Location [facility]  Referral Date to RadOnc/MedOnc -  Navigator Encounter Type Follow-up Appt  Telephone -  Patient Visit Type MedOnc  Treatment Phase -  Barriers/Navigation Needs Education;Financial Toxicity  Education Other  Interventions Education;Psycho-Social Support  Acuity Level 2-Minimal Needs (1-2 Barriers Identified)  Referrals -  Coordination of Care -  Education Method Verbal  Support Groups/Services Friends and Family  Time Spent with Patient 30

## 2020-02-02 NOTE — Telephone Encounter (Signed)
Appointments scheduled calendar printed & mailed per 10/12 los 

## 2020-02-02 NOTE — Progress Notes (Signed)
Hematology and Oncology Follow Up Visit  Kim Edwards [id number] July 07, 1955 64 y.o. 02/02/2020   Principle Diagnosis:  History of cervical cancer  Pulmonary emboli within the segmental branches of the right lower lobe  Current Therapy:   Observation Xarelto starter pack    Interim History:  Kim Edwards is here today for follow-up. She had her CT of the chest abdomen and pelvis to assess for possible recurrence of cervical cancer. This revealed right lower lobe PE.  She had surgery on her lumbar spine on 02/26/2020. She was immobile for the following 120 days due to abdominal stitches which may have contributed to her developing the thrombus.  She had not travelled.  She was symptomatic prior to diagnosis with fatigue, SOB and fever. She states that her covid testing was negative. She has had her covid vaccines.  She was finally able to start her Xarelto starter pack yesterday.  She still has facial and neck swelling. She still has some fatigue.  No fever, chills, n/v, cough, rash, dizziness, SOB, chest pain, palpitations, abdominal pain or changes in bowel or bladder habits.  She states that 3 weeks ago she had pale pink vaginal discharge for 3 days. She has had no other episodes. She states that her last cycle was at the age of 10 or 56. If this recurs she will contact Dr. Birdie Riddle for further work up.  No swelling, tenderness, numbness or tingling in her extremities.  She states that the hand cramps she had been having have resolved.  No falls or syncopal episodes to report.  She has maintained a good appetite (no issues with swallowing and choking) and is making an increased effort to stay well hydrated.   ECOG Performance Status: 1 - Symptomatic but completely ambulatory  Medications:  Allergies as of 02/02/2020      Reactions   Buspar [buspirone Hcl] Other (See Comments)   Joints hurt   Oxycodone-acetaminophen Other (See Comments)   upset stomach      Medication List         Accurate as of February 02, 2020 10:26 AM. If you have any questions, ask your nurse or doctor.        STOP taking these medications   amoxicillin-clavulanate 875-125 MG tablet Commonly known as: AUGMENTIN Stopped by: Laverna Peace, NP   cyclobenzaprine 10 MG tablet Commonly known as: FLEXERIL Stopped by: Laverna Peace, NP   HYDROcodone-acetaminophen 5-325 MG tablet Commonly known as: NORCO/VICODIN Stopped by: Laverna Peace, NP     TAKE these medications   fenofibrate 160 MG tablet Take 1 tablet (160 mg total) by mouth daily.   ondansetron 4 MG disintegrating tablet Commonly known as: ZOFRAN-ODT Take 4 mg by mouth every 6 (six) hours as needed.   Rivaroxaban Stater Pack (15 mg and 20 mg) Commonly known as: XARELTO STARTER PACK Follow package directions: Take one 15mg  tablet by mouth twice a day. On day 22, switch to one 20mg  tablet once a day. Take with food.   rosuvastatin 20 MG tablet Commonly known as: CRESTOR Take 1 tablet (20 mg total) by mouth daily.   traZODone 50 MG tablet Commonly known as: DESYREL TAKE 1/2 TO 1 TABLET BY MOUTH EVERY NIGHT AT BEDTIME AS NEEDED FOR SLEEP       Allergies:  Allergies  Allergen Reactions  . Buspar [Buspirone Hcl] Other (See Comments)    Joints hurt   . Oxycodone-Acetaminophen Other (See Comments)    upset stomach    Past Medical History, Surgical  history, Social history, and Family History were reviewed and updated.  Review of Systems: All other 10 point review of systems is negative.   Physical Exam:  vitals were not taken for this visit.   Wt Readings from Last 3 Encounters:  01/22/20 138 lb (62.6 kg)  01/15/20 142 lb (64.4 kg)  12/30/19 142 lb (64.4 kg)    Ocular: Sclerae unicteric, pupils equal, round and reactive to light Ear-nose-throat: Oropharynx clear, dentition fair Lymphatic: No cervical or supraclavicular adenopathy Lungs no rales or rhonchi, good excursion bilaterally Heart regular  rate and rhythm, no murmur appreciated Abd soft, nontender, positive bowel sounds MSK no focal spinal tenderness, no joint edema Neuro: non-focal, well-oriented, appropriate affect Breasts: Deferred   Lab Results  Component Value Date   WBC 6.2 01/22/2020   HGB 12.3 01/22/2020   HCT 37.3 01/22/2020   MCV 98.2 01/22/2020   PLT 275 01/22/2020   No results found for: FERRITIN, IRON, TIBC, UIBC, IRONPCTSAT Lab Results  Component Value Date   RBC 3.80 (L) 01/22/2020   No results found for: KPAFRELGTCHN, LAMBDASER, KAPLAMBRATIO No results found for: IGGSERUM, IGA, IGMSERUM No results found for: Odetta Pink, SPEI   Chemistry      Component Value Date/Time   NA 136 01/22/2020 1039   K 3.6 01/22/2020 1039   CL 99 01/22/2020 1039   CO2 27 01/22/2020 1039   BUN 10 01/22/2020 1039   CREATININE 0.72 01/22/2020 1039   CREATININE 0.63 02/11/2015 1505      Component Value Date/Time   CALCIUM 9.4 01/22/2020 1039   ALKPHOS 26 (L) 01/22/2020 1039   AST 14 (L) 01/22/2020 1039   ALT 14 01/22/2020 1039   BILITOT 0.5 01/22/2020 1039       Impression and Plan: Kim Edwards is a very pleasant 64 yo caucasian female with history of cervical cancer and most recently had facial and neck swelling.  She has PE of the right lower lobe and has started her Xarelto. We will plan to do a CT angio in 12 weeks.  We will see her again in 1 month for follow-up.  She can contact our office with any questions or concerns.   Laverna Peace, NP 10/12/202110:26 AM

## 2020-02-03 LAB — PROTEIN S ACTIVITY: Protein S Activity: 171 % — ABNORMAL HIGH (ref 63–140)

## 2020-02-03 LAB — HOMOCYSTEINE: Homocysteine: 9.3 umol/L (ref 0.0–17.2)

## 2020-02-03 LAB — PROTEIN C ACTIVITY: Protein C Activity: 124 % (ref 73–180)

## 2020-02-03 LAB — PROTEIN S, TOTAL: Protein S Ag, Total: 123 % (ref 60–150)

## 2020-02-04 LAB — LUPUS ANTICOAGULANT PANEL
DRVVT: 88.8 s — ABNORMAL HIGH (ref 0.0–47.0)
PTT Lupus Anticoagulant: 39.7 s (ref 0.0–51.9)

## 2020-02-04 LAB — CARDIOLIPIN ANTIBODIES, IGG, IGM, IGA
Anticardiolipin IgA: <9 APL U/mL (ref 0–11)
Anticardiolipin IgG: <9 GPL U/mL (ref 0–14)
Anticardiolipin IgM: 30 MPL U/mL — ABNORMAL HIGH (ref 0–12)

## 2020-02-04 LAB — DRVVT MIX: dRVVT Mix: 68.1 s — ABNORMAL HIGH (ref 0.0–40.4)

## 2020-02-04 LAB — BETA-2-GLYCOPROTEIN I ABS, IGG/M/A
Beta-2 Glyco I IgG: <9 GPI IgG units (ref 0–20)
Beta-2-Glycoprotein I IgA: <9 GPI IgA units (ref 0–25)
Beta-2-Glycoprotein I IgM: <9 GPI IgM units (ref 0–32)

## 2020-02-04 LAB — DRVVT CONFIRM: dRVVT Confirm: 1.5 ratio — ABNORMAL HIGH (ref 0.8–1.2)

## 2020-02-04 LAB — PROTEIN C, TOTAL: Protein C, Total: 113 % (ref 60–150)

## 2020-02-05 LAB — FACTOR 5 LEIDEN

## 2020-02-05 LAB — PROTHROMBIN GENE MUTATION

## 2020-02-08 ENCOUNTER — Other Ambulatory Visit: Payer: Self-pay | Admitting: Family

## 2020-02-08 ENCOUNTER — Telehealth: Payer: Self-pay | Admitting: *Deleted

## 2020-02-08 DIAGNOSIS — I2602 Saddle embolus of pulmonary artery with acute cor pulmonale: Secondary | ICD-10-CM

## 2020-02-08 DIAGNOSIS — R760 Raised antibody titer: Secondary | ICD-10-CM

## 2020-02-08 NOTE — Telephone Encounter (Signed)
-----   Message from Eliezer Bottom, NP sent at 02/08/2020 10:08 AM EDT ----- Hyper coag work up only showed a positive lupus anticoagulant which is associated with a predisposition to clot. We see this often in folks with PE and will recheck her level at her follow-up! Continue her Xarelto and call if she needs anything!  ----- Message ----- From: Interface, Lab In [place] Sent: 02/02/2020  10:30 AM EDT To: Eliezer Bottom, NP

## 2020-02-08 NOTE — Telephone Encounter (Signed)
Message left to notify patient per order of [place]. [place] NP that the "hyper coag work up only showed a positive lupus anticoagulant which is associated with a predisposition to clot.  We see this often in folks with PE and will recheck your level at your follow-up!  Continue your Xarelto and call if you need anything!"  Infomed pt to call office back with any questions or concerns.

## 2020-02-17 ENCOUNTER — Telehealth: Payer: Self-pay | Admitting: *Deleted

## 2020-02-17 ENCOUNTER — Other Ambulatory Visit: Payer: Self-pay | Admitting: *Deleted

## 2020-02-17 MED ORDER — AMOXICILLIN-POT CLAVULANATE 875-125 MG PO TABS: 1.0000 | ORAL_TABLET | Freq: Two times a day (BID) | ORAL | 0 refills | Status: AC

## 2020-02-17 NOTE — Telephone Encounter (Signed)
Received call from patient stating that she has soreness and possible infection under her gum.  Asking if there are any restrictions with her Xarelto.  Spoke with Dr Marin Olp. States that if there was anything invasive then she would need to contact us regarding Xarelto instructions.  Dr Marin Olp sent Rx of Augmentin to her pharmacy.  Patient appreciative of this.

## 2020-02-22 ENCOUNTER — Other Ambulatory Visit: Payer: Managed Care, Other (non HMO)

## 2020-02-22 ENCOUNTER — Ambulatory Visit: Payer: Managed Care, Other (non HMO) | Admitting: Hematology & Oncology

## 2020-03-01 ENCOUNTER — Inpatient Hospital Stay: Payer: Managed Care, Other (non HMO) | Attending: Hematology & Oncology

## 2020-03-01 ENCOUNTER — Other Ambulatory Visit: Payer: Self-pay | Admitting: Family

## 2020-03-01 ENCOUNTER — Other Ambulatory Visit: Payer: Self-pay

## 2020-03-01 ENCOUNTER — Encounter: Payer: Self-pay | Admitting: Family

## 2020-03-01 ENCOUNTER — Inpatient Hospital Stay (HOSPITAL_BASED_OUTPATIENT_CLINIC_OR_DEPARTMENT_OTHER): Payer: Managed Care, Other (non HMO) | Admitting: Family

## 2020-03-01 VITALS — BP 143/75 | HR 73 | Temp 98.1°F | Resp 16 | Ht 62.0 in | Wt 143.4 lb

## 2020-03-01 DIAGNOSIS — Z87891 Personal history of nicotine dependence: Secondary | ICD-10-CM | POA: Insufficient documentation

## 2020-03-01 DIAGNOSIS — R760 Raised antibody titer: Secondary | ICD-10-CM

## 2020-03-01 DIAGNOSIS — I2699 Other pulmonary embolism without acute cor pulmonale: Secondary | ICD-10-CM | POA: Insufficient documentation

## 2020-03-01 DIAGNOSIS — I2690 Septic pulmonary embolism without acute cor pulmonale: Secondary | ICD-10-CM

## 2020-03-01 DIAGNOSIS — Z8541 Personal history of malignant neoplasm of cervix uteri: Secondary | ICD-10-CM | POA: Insufficient documentation

## 2020-03-01 DIAGNOSIS — Z7901 Long term (current) use of anticoagulants: Secondary | ICD-10-CM | POA: Diagnosis not present

## 2020-03-01 DIAGNOSIS — D6859 Other primary thrombophilia: Secondary | ICD-10-CM

## 2020-03-01 DIAGNOSIS — I2602 Saddle embolus of pulmonary artery with acute cor pulmonale: Secondary | ICD-10-CM

## 2020-03-01 LAB — CBC WITH DIFFERENTIAL (CANCER CENTER ONLY)
Abs Immature Granulocytes: 0.02 10*3/uL (ref 0.00–0.07)
Basophils Absolute: 0.1 10*3/uL (ref 0.0–0.1)
Basophils Relative: 1 %
Eosinophils Absolute: 0.2 10*3/uL (ref 0.0–0.5)
Eosinophils Relative: 3 %
HCT: 41 % (ref 36.0–46.0)
Hemoglobin: 13.4 g/dL (ref 12.0–15.0)
Immature Granulocytes: 0 %
Lymphocytes Relative: 24 %
Lymphs Abs: 1.5 10*3/uL (ref 0.7–4.0)
MCH: 32.1 pg (ref 26.0–34.0)
MCHC: 32.7 g/dL (ref 30.0–36.0)
MCV: 98.1 fL (ref 80.0–100.0)
Monocytes Absolute: 0.5 10*3/uL (ref 0.1–1.0)
Monocytes Relative: 8 %
Neutro Abs: 4.1 10*3/uL (ref 1.7–7.7)
Neutrophils Relative %: 64 %
Platelet Count: 259 10*3/uL (ref 150–400)
RBC: 4.18 MIL/uL (ref 3.87–5.11)
RDW: 13.4 % (ref 11.5–15.5)
WBC Count: 6.4 10*3/uL (ref 4.0–10.5)
nRBC: 0 % (ref 0.0–0.2)

## 2020-03-01 LAB — CMP (CANCER CENTER ONLY)
ALT: 12 U/L (ref 0–44)
AST: 21 U/L (ref 15–41)
Albumin: 4.2 g/dL (ref 3.5–5.0)
Alkaline Phosphatase: 22 U/L — ABNORMAL LOW (ref 38–126)
Anion gap: 9 (ref 5–15)
BUN: 19 mg/dL (ref 8–23)
CO2: 27 mmol/L (ref 22–32)
Calcium: 9.4 mg/dL (ref 8.9–10.3)
Chloride: 103 mmol/L (ref 98–111)
Creatinine: 0.76 mg/dL (ref 0.44–1.00)
GFR, Estimated: >60 mL/min (ref >60)
Glucose, Bld: 148 mg/dL — ABNORMAL HIGH (ref 70–99)
Potassium: 3.8 mmol/L (ref 3.5–5.1)
Sodium: 139 mmol/L (ref 135–145)
Total Bilirubin: 0.4 mg/dL (ref 0.3–1.2)
Total Protein: 6.7 g/dL (ref 6.5–8.1)

## 2020-03-01 MED ORDER — RIVAROXABAN 20 MG PO TABS: 20.0000 mg | ORAL_TABLET | Freq: Every day | ORAL | 4 refills | Status: DC

## 2020-03-01 NOTE — Progress Notes (Signed)
Hematology and Oncology Follow Up Visit  Kim Edwards [id number] [birth year] 64 y.o. 03/01/2020   Principle Diagnosis:  History of cervical cancer  Pulmonary emboli within the segmental branches of the right lower lobe  Current Therapy:        Observation Xarelto 20 mg PO daily   Interim History:  Kim Edwards is here today for follow-up. She is doing well on Xarelto and has had no issues with bleeding. No bruising or petechiae.  She still has some swelling in the neck and face but notes that this is improving.  She has noted sensitive teeth and has to warm up all foods and liquids before drinking. She went to see her dentist and got a clean report. They plan to do a topical sensitivity treatment to help her.  No fever, chills, n/v, cough, rash, dizziness, SOB, chest pain, palpitations, abdominal pain or changes in bowel or bladder habits.  No swelling, tenderness, numbness or tingling in her extremities.  No falls or syncope.  She has maintained a good appetite and is staying well hydrated. Her weight is stable.   ECOG Performance Status: 1 - Symptomatic but completely ambulatory  Medications:  Allergies as of 03/01/2020      Reactions   Buspar [buspirone Hcl] Other (See Comments)   Joints hurt   Oxycodone-acetaminophen Other (See Comments)   upset stomach      Medication List       Accurate as of March 01, 2020 11:03 AM. If you have any questions, ask your nurse or doctor.        fenofibrate 160 MG tablet Take 1 tablet (160 mg total) by mouth daily.   ondansetron 4 MG disintegrating tablet Commonly known as: ZOFRAN-ODT Take 4 mg by mouth every 6 (six) hours as needed.   Rivaroxaban Stater Pack (15 mg and 20 mg) Commonly known as: XARELTO STARTER PACK Follow package directions: Take one 15mg  tablet by mouth twice a day. On day 22, switch to one 20mg  tablet once a day. Take with food.   rosuvastatin 20 MG tablet Commonly known as: CRESTOR Take 1 tablet (20 mg  total) by mouth daily.   traZODone 50 MG tablet Commonly known as: DESYREL TAKE 1/2 TO 1 TABLET BY MOUTH EVERY NIGHT AT BEDTIME AS NEEDED FOR SLEEP       Allergies:  Allergies  Allergen Reactions   Buspar [Buspirone Hcl] Other (See Comments)    Joints hurt    Oxycodone-Acetaminophen Other (See Comments)    upset stomach    Past Medical History, Surgical history, Social history, and Family History were reviewed and updated.  Review of Systems: All other 10 point review of systems is negative.   Physical Exam:  vitals were not taken for this visit.   Wt Readings from Last 3 Encounters:  02/02/20 142 lb 1.9 oz (64.5 kg)  01/22/20 138 lb (62.6 kg)  01/15/20 142 lb (64.4 kg)    Ocular: Sclerae unicteric, pupils equal, round and reactive to light Ear-nose-throat: Oropharynx clear, dentition fair Lymphatic: No cervical or supraclavicular adenopathy Lungs no rales or rhonchi, good excursion bilaterally Heart regular rate and rhythm, no murmur appreciated Abd soft, nontender, positive bowel sounds MSK no focal spinal tenderness, no joint edema Neuro: non-focal, well-oriented, appropriate affect Breasts: Deferred   Lab Results  Component Value Date   WBC 6.4 03/01/2020   HGB 13.4 03/01/2020   HCT 41.0 03/01/2020   MCV 98.1 03/01/2020   PLT 259 03/01/2020   No results found  for: FERRITIN, IRON, TIBC, UIBC, IRONPCTSAT Lab Results  Component Value Date   RBC 4.18 03/01/2020   No results found for: KPAFRELGTCHN, LAMBDASER, KAPLAMBRATIO No results found for: IGGSERUM, IGA, IGMSERUM No results found for: Odetta Pink, SPEI   Chemistry      Component Value Date/Time   NA 136 01/22/2020 1039   K 3.6 01/22/2020 1039   CL 99 01/22/2020 1039   CO2 27 01/22/2020 1039   BUN 10 01/22/2020 1039   CREATININE 0.72 01/22/2020 1039   CREATININE 0.63 02/11/2015 1505      Component Value Date/Time   CALCIUM 9.4 01/22/2020  1039   ALKPHOS 26 (L) 01/22/2020 1039   AST 14 (L) 01/22/2020 1039   ALT 14 01/22/2020 1039   BILITOT 0.5 01/22/2020 1039       Impression and Plan: Kim Edwards is a very pleasant 64 yo caucasian female with history of cervical cancer and most recently PE of the right lower lobe with facial and neck swelling.  She was positive for the Lupus anticoagulant and elevated anticardiolipin IgM level at 30.   She will continue her same regimen with Xarelto 20 mg PO daily. New script sent to our pharmacy downstairs with refills.  We will plan to see her again in another 2 months for follow-up and repeat a CT angio that same day.  She can contact our office with any questions or concerns.  Laverna Peace, NP 11/9/202111:03 AM

## 2020-03-02 ENCOUNTER — Telehealth: Payer: Self-pay

## 2020-03-02 MED FILL — XARELTO 20 MG TABLET: 20 | 30 days supply | Qty: 30 | Fill #0

## 2020-03-02 NOTE — Telephone Encounter (Signed)
Called and left a vm with 05/02/20 appts per 11/9 los.Marland KitchenMarland KitchenAOM

## 2020-03-03 ENCOUNTER — Other Ambulatory Visit: Payer: Self-pay | Admitting: *Deleted

## 2020-03-03 DIAGNOSIS — Z8541 Personal history of malignant neoplasm of cervix uteri: Secondary | ICD-10-CM

## 2020-03-03 LAB — LUPUS ANTICOAGULANT PANEL
DRVVT: 57 s — ABNORMAL HIGH (ref 0.0–47.0)
PTT Lupus Anticoagulant: 32.6 s (ref 0.0–51.9)

## 2020-03-03 LAB — DRVVT MIX: dRVVT Mix: 54.1 s — ABNORMAL HIGH (ref 0.0–40.4)

## 2020-03-03 LAB — DRVVT CONFIRM: dRVVT Confirm: 1.1 ratio (ref 0.8–1.2)

## 2020-03-04 ENCOUNTER — Inpatient Hospital Stay: Payer: Managed Care, Other (non HMO)

## 2020-03-14 ENCOUNTER — Other Ambulatory Visit: Payer: Self-pay

## 2020-03-14 ENCOUNTER — Telehealth: Payer: Self-pay | Admitting: Family Medicine

## 2020-03-14 DIAGNOSIS — E785 Hyperlipidemia, unspecified: Secondary | ICD-10-CM

## 2020-03-14 MED ORDER — FENOFIBRATE 160 MG PO TABS: 160.0000 mg | ORAL_TABLET | Freq: Every day | ORAL | 0 refills | Status: DC

## 2020-03-14 MED ORDER — ROSUVASTATIN CALCIUM 20 MG PO TABS: 20.0000 mg | ORAL_TABLET | Freq: Every day | ORAL | 0 refills | Status: DC

## 2020-03-14 NOTE — Telephone Encounter (Signed)
Yes.  Just F.Y.I 

## 2020-03-14 NOTE — Telephone Encounter (Signed)
Did you mean to send this back to me?

## 2020-03-14 NOTE — Telephone Encounter (Signed)
Pt called in asking for a refill on the fenofibrate 160mg  and Rosuvastatin 20mg  please send to the [facility] on [address].

## 2020-03-14 NOTE — Telephone Encounter (Signed)
Rx for Fenofibrate160mg  and Rosuvastatin 20mg  sent to [facility]

## 2020-04-01 ENCOUNTER — Other Ambulatory Visit: Payer: Self-pay | Admitting: Hematology & Oncology

## 2020-04-01 DIAGNOSIS — I2699 Other pulmonary embolism without acute cor pulmonale: Secondary | ICD-10-CM

## 2020-04-01 MED FILL — XARELTO 20 MG TABLET: 20 | 30 days supply | Qty: 30 | Fill #1

## 2020-05-02 ENCOUNTER — Ambulatory Visit: Payer: Managed Care, Other (non HMO) | Admitting: Family

## 2020-05-02 ENCOUNTER — Inpatient Hospital Stay: Payer: Managed Care, Other (non HMO) | Admitting: Family

## 2020-05-02 ENCOUNTER — Other Ambulatory Visit: Payer: Self-pay

## 2020-05-02 ENCOUNTER — Inpatient Hospital Stay: Payer: Managed Care, Other (non HMO) | Attending: Hematology & Oncology

## 2020-05-02 ENCOUNTER — Ambulatory Visit (HOSPITAL_BASED_OUTPATIENT_CLINIC_OR_DEPARTMENT_OTHER)
Admission: RE | Admit: 2020-05-02 | Discharge: 2020-05-02 | Disposition: A | Discharge status: Home / Self Care | Payer: Managed Care, Other (non HMO) | Source: Ambulatory Visit | Attending: Family | Admitting: Family

## 2020-05-02 ENCOUNTER — Encounter: Payer: Self-pay | Admitting: Family

## 2020-05-02 ENCOUNTER — Encounter (HOSPITAL_BASED_OUTPATIENT_CLINIC_OR_DEPARTMENT_OTHER): Payer: Self-pay

## 2020-05-02 VITALS — BP 141/58 | HR 69 | Temp 97.5°F | Resp 18 | Ht 62.0 in | Wt 144.1 lb

## 2020-05-02 DIAGNOSIS — R760 Raised antibody titer: Secondary | ICD-10-CM

## 2020-05-02 DIAGNOSIS — I2602 Saddle embolus of pulmonary artery with acute cor pulmonale: Secondary | ICD-10-CM

## 2020-05-02 DIAGNOSIS — D6859 Other primary thrombophilia: Secondary | ICD-10-CM

## 2020-05-02 LAB — CMP (CANCER CENTER ONLY)
ALT: 41 U/L (ref 0–44)
AST: 73 U/L — ABNORMAL HIGH (ref 15–41)
Albumin: 4.3 g/dL (ref 3.5–5.0)
Alkaline Phosphatase: 32 U/L — ABNORMAL LOW (ref 38–126)
Anion gap: 13 (ref 5–15)
BUN: 14 mg/dL (ref 8–23)
CO2: 26 mmol/L (ref 22–32)
Calcium: 9.3 mg/dL (ref 8.9–10.3)
Chloride: 99 mmol/L (ref 98–111)
Creatinine: 0.79 mg/dL (ref 0.44–1.00)
GFR, Estimated: >60 mL/min (ref >60)
Glucose, Bld: 148 mg/dL — ABNORMAL HIGH (ref 70–99)
Potassium: 3.8 mmol/L (ref 3.5–5.1)
Sodium: 138 mmol/L (ref 135–145)
Total Bilirubin: 0.7 mg/dL (ref 0.3–1.2)
Total Protein: 7.3 g/dL (ref 6.5–8.1)

## 2020-05-02 LAB — CBC WITH DIFFERENTIAL (CANCER CENTER ONLY)
Abs Immature Granulocytes: 0.02 10*3/uL (ref 0.00–0.07)
Basophils Absolute: 0 10*3/uL (ref 0.0–0.1)
Basophils Relative: 1 %
Eosinophils Absolute: 0.1 10*3/uL (ref 0.0–0.5)
Eosinophils Relative: 2 %
HCT: 44.1 % (ref 36.0–46.0)
Hemoglobin: 14.8 g/dL (ref 12.0–15.0)
Immature Granulocytes: 0 %
Lymphocytes Relative: 31 %
Lymphs Abs: 1.8 10*3/uL (ref 0.7–4.0)
MCH: 31.4 pg (ref 26.0–34.0)
MCHC: 33.6 g/dL (ref 30.0–36.0)
MCV: 93.4 fL (ref 80.0–100.0)
Monocytes Absolute: 0.6 10*3/uL (ref 0.1–1.0)
Monocytes Relative: 10 %
Neutro Abs: 3.3 10*3/uL (ref 1.7–7.7)
Neutrophils Relative %: 56 %
Platelet Count: 242 10*3/uL (ref 150–400)
RBC: 4.72 MIL/uL (ref 3.87–5.11)
RDW: 12.1 % (ref 11.5–15.5)
WBC Count: 5.8 10*3/uL (ref 4.0–10.5)
nRBC: 0 % (ref 0.0–0.2)

## 2020-05-02 MED ORDER — IOHEXOL 350 MG/ML SOLN
100.0000 mL | Freq: Once | INTRAVENOUS | Status: AC | PRN
  Administered 2020-05-02: 100 mL via INTRAVENOUS

## 2020-05-02 NOTE — Progress Notes (Signed)
Hematology and Oncology Follow Up Visit  Jemiah Cuadra [id number] Nov 11, 1955 65 y.o. 05/02/2020   Principle Diagnosis:  History of cervical cancer  Pulmonary emboli within the segmental branches of the right lower lobe  Current Therapy: Observation Xarelto 20 mg PO daily   Interim History:  Ms. Bartell is here today for follow-up. She is doing well. The swelling in the left neck continues to improve.  She verbalized that she is taking her Xarelto daily as prescribed. No blood loss noted. No bruising or petechiae.  Repeat CT angio today showed no evidence of residual or recurrent PE.  No fever, chills, n/v, cough, rash, dizziness, SOB, chest pain, palpitations, abdominal pain or changes in bowel or bladder habits.  No swelling, tenderness, numbness or tingling in her extremities. She has maintained a good appetite and stays well hydrated throughout the day. Her weight is stable at 144 lbs.   ECOG Performance Status: 1 - Symptomatic but completely ambulatory  Medications:  Allergies as of 05/02/2020      Reactions   Buspar [buspirone Hcl] Other (See Comments)   Joints hurt   Oxycodone-acetaminophen Other (See Comments)   upset stomach      Medication List       Accurate as of May 02, 2020 11:31 AM. If you have any questions, ask your nurse or doctor.        fenofibrate 160 MG tablet Take 1 tablet (160 mg total) by mouth daily.   ondansetron 4 MG disintegrating tablet Commonly known as: ZOFRAN-ODT Take 4 mg by mouth every 6 (six) hours as needed.   rivaroxaban 20 MG Tabs tablet Commonly known as: XARELTO Take 1 tablet (20 mg total) by mouth daily with supper.   Xarelto Starter Pack Generic drug: Rivaroxaban Stater Pack (15 mg and 20 mg) FOLLOW PACKAGE DIRECTIONS: TAKE ONE 15MG  TABLET BY MOUTH TWICE A DAY. ON DAY 22, SWITCH TO ONE 20MG  TABLET ONCE A DAY. TAKE WITH FOOD.   rosuvastatin 20 MG tablet Commonly known as: CRESTOR Take 1 tablet (20 mg total)  by mouth daily.   traZODone 50 MG tablet Commonly known as: DESYREL TAKE 1/2 TO 1 TABLET BY MOUTH EVERY NIGHT AT BEDTIME AS NEEDED FOR SLEEP       Allergies:  Allergies  Allergen Reactions  . Buspar [Buspirone Hcl] Other (See Comments)    Joints hurt   . Oxycodone-Acetaminophen Other (See Comments)    upset stomach    Past Medical History, Surgical history, Social history, and Family History were reviewed and updated.  Review of Systems: All other 10 point review of systems is negative.   Physical Exam:  vitals were not taken for this visit.   Wt Readings from Last 3 Encounters:  03/01/20 143 lb 6.4 oz (65 kg)  02/02/20 142 lb 1.9 oz (64.5 kg)  01/22/20 138 lb (62.6 kg)    Ocular: Sclerae unicteric, pupils equal, round and reactive to light Ear-nose-throat: Oropharynx clear, dentition fair Lymphatic: No cervical or supraclavicular adenopathy Lungs no rales or rhonchi, good excursion bilaterally Heart regular rate and rhythm, no murmur appreciated Abd soft, nontender, positive bowel sounds MSK no focal spinal tenderness, no joint edema Neuro: non-focal, well-oriented, appropriate affect Breasts: Deferred   Lab Results  Component Value Date   WBC 5.8 05/02/2020   HGB 14.8 05/02/2020   HCT 44.1 05/02/2020   MCV 93.4 05/02/2020   PLT 242 05/02/2020   No results found for: FERRITIN, IRON, TIBC, UIBC, IRONPCTSAT Lab Results  Component Value Date  RBC 4.72 05/02/2020   No results found for: KPAFRELGTCHN, LAMBDASER, KAPLAMBRATIO No results found for: IGGSERUM, IGA, IGMSERUM No results found for: Odetta Pink, SPEI   Chemistry      Component Value Date/Time   NA 138 05/02/2020 0950   K 3.8 05/02/2020 0950   CL 99 05/02/2020 0950   CO2 26 05/02/2020 0950   BUN 14 05/02/2020 0950   CREATININE 0.79 05/02/2020 0950   CREATININE 0.63 02/11/2015 1505      Component Value Date/Time   CALCIUM 9.3 05/02/2020  0950   ALKPHOS 32 (L) 05/02/2020 0950   AST 73 (H) 05/02/2020 0950   ALT 41 05/02/2020 0950   BILITOT 0.7 05/02/2020 0950       Impression and Plan: Ms. Cincotta is a very pleasant 65 yo caucasian female with history of cervical cancer and most recently PE of the right lower lobe with facial and neck swelling, positive for the lupus anticoagulant and elevated anticardiolipin IgM level. These labs were rechecked today and results are pending.  Repeat CT angio today showed resolution of PE.  She will continue her same regimen with Xarelto.    Follow-up in 4 months.  She can contact our office with any questions or concerns.   Laverna Peace, NP 1/10/202211:31 AM

## 2020-05-04 LAB — CARDIOLIPIN ANTIBODIES, IGG, IGM, IGA
Anticardiolipin IgA: <9 APL U/mL (ref 0–11)
Anticardiolipin IgG: <9 GPL U/mL (ref 0–14)
Anticardiolipin IgM: 9 MPL U/mL (ref 0–12)

## 2020-05-04 LAB — LUPUS ANTICOAGULANT PANEL
DRVVT: 118.6 s — ABNORMAL HIGH (ref 0.0–47.0)
PTT Lupus Anticoagulant: 45.5 s (ref 0.0–51.9)

## 2020-05-04 LAB — DRVVT MIX: dRVVT Mix: 102.6 s — ABNORMAL HIGH (ref 0.0–40.4)

## 2020-05-04 LAB — DRVVT CONFIRM: dRVVT Confirm: 1.4 ratio — ABNORMAL HIGH (ref 0.8–1.2)

## 2020-05-06 MED FILL — XARELTO 20 MG TABLET: 20 | 30 days supply | Qty: 30 | Fill #2

## 2020-06-13 MED FILL — XARELTO 20 MG TABLET: 20 | 30 days supply | Qty: 30 | Fill #3

## 2020-06-30 ENCOUNTER — Other Ambulatory Visit: Payer: Self-pay

## 2020-06-30 ENCOUNTER — Telehealth: Payer: Self-pay | Admitting: Family Medicine

## 2020-06-30 DIAGNOSIS — E785 Hyperlipidemia, unspecified: Secondary | ICD-10-CM

## 2020-06-30 MED ORDER — ROSUVASTATIN CALCIUM 20 MG PO TABS: 20.0000 mg | ORAL_TABLET | Freq: Every day | ORAL | 0 refills | Status: DC

## 2020-06-30 MED ORDER — FENOFIBRATE 160 MG PO TABS: 160.0000 mg | ORAL_TABLET | Freq: Every day | ORAL | 0 refills | Status: DC

## 2020-06-30 NOTE — Telephone Encounter (Signed)
Called patient and left a vm informing her that she has not been seen for cholesterol since 07/06/19 and that I sent in a 30 day supply 0 refills until an appt has been scheduled. Pt will return call to office to schedule an appt.

## 2020-06-30 NOTE — Telephone Encounter (Signed)
Pt called in on 06/29/20 asking for refills on her Fenofibrate 160mg  and her  Rosuvaftatin-Calcium 20mg .   Please advise   She called team health while we were in a staff meeting.

## 2020-07-22 ENCOUNTER — Ambulatory Visit: Payer: Managed Care, Other (non HMO) | Attending: Internal Medicine

## 2020-07-22 DIAGNOSIS — Z23 Encounter for immunization: Secondary | ICD-10-CM

## 2020-07-22 NOTE — Progress Notes (Signed)
   Covid-19 Vaccination Clinic  Name:  Kim Edwards    MRN: [id number] DOB: January 19, 1956  07/22/2020  Kim Edwards was observed post Covid-19 immunization for 15 minutes without incident. She was provided with Vaccine Information Sheet and instruction to access the V-Safe system.   Kim Edwards was instructed to call 911 with any severe reactions post vaccine: Marland Kitchen Difficulty breathing  . Swelling of face and throat  . A fast heartbeat  . A bad rash all over body  . Dizziness and weakness   Immunizations Administered    Name Date Dose VIS Date Route   Moderna Covid-19 Booster Vaccine 07/22/2020  1:27 PM 0.25 mL 02/10/2020 Intramuscular   Manufacturer: Levan Hurst   Lot: [id number]   [place]anna: [phone]

## 2020-08-01 ENCOUNTER — Other Ambulatory Visit (HOSPITAL_BASED_OUTPATIENT_CLINIC_OR_DEPARTMENT_OTHER): Payer: Self-pay

## 2020-08-01 MED ORDER — COVID-19 MRNA VACC (MODERNA) 100 MCG/0.5ML IM SUSP
INTRAMUSCULAR | 0 refills | Status: DC
  Filled 2020-08-01: qty 0.25, 1d supply, fill #0

## 2020-08-11 ENCOUNTER — Other Ambulatory Visit (HOSPITAL_BASED_OUTPATIENT_CLINIC_OR_DEPARTMENT_OTHER): Payer: Self-pay

## 2020-08-11 MED FILL — Rivaroxaban Tab 20 MG: ORAL | 30 days supply | Qty: 30 | Fill #0 | Status: AC

## 2020-08-16 ENCOUNTER — Other Ambulatory Visit (HOSPITAL_BASED_OUTPATIENT_CLINIC_OR_DEPARTMENT_OTHER): Payer: Self-pay

## 2020-08-18 ENCOUNTER — Other Ambulatory Visit: Payer: Self-pay

## 2020-08-18 ENCOUNTER — Telehealth: Payer: Self-pay | Admitting: Family Medicine

## 2020-08-18 DIAGNOSIS — E785 Hyperlipidemia, unspecified: Secondary | ICD-10-CM

## 2020-08-18 MED ORDER — FENOFIBRATE 160 MG PO TABS: 160.0000 mg | ORAL_TABLET | Freq: Every day | ORAL | 0 refills | Status: DC

## 2020-08-18 MED ORDER — ROSUVASTATIN CALCIUM 20 MG PO TABS: 20.0000 mg | ORAL_TABLET | Freq: Every day | ORAL | 0 refills | Status: DC

## 2020-08-18 NOTE — Telephone Encounter (Signed)
Medication Refills  Last OV:  Medication: Fenofibrate & rosuvastin Pharmacy:  [facility] [address] in [place]  Let patient know to contact pharmacy at the end of the day to make sure medication is ready.   Please notify patient to allow 48-72 hours to process.  Encourage patient to contact the pharmacy for refills or they can request refills through [place] out below:   Last refill:  QTY:  Refill Date:    Other Comments:   Okay for refill?  Please advise.

## 2020-08-18 NOTE — Telephone Encounter (Signed)
Medications sent to pharmacy

## 2020-08-30 ENCOUNTER — Inpatient Hospital Stay: Payer: Managed Care, Other (non HMO) | Attending: Family Medicine

## 2020-08-30 ENCOUNTER — Inpatient Hospital Stay: Payer: Managed Care, Other (non HMO) | Admitting: Family

## 2020-08-31 ENCOUNTER — Telehealth: Payer: Self-pay

## 2020-08-31 NOTE — Telephone Encounter (Signed)
Called pt and left a new appt per sch message as she was a no show for her 08/30/20 appt   Kim Edwards

## 2020-09-01 ENCOUNTER — Telehealth: Payer: Self-pay

## 2020-09-01 NOTE — Telephone Encounter (Signed)
Cancel 09/23/20 appt per vanessa-secure chat, pt has been out of town and will c/b to r/s at a later date   [place]

## 2020-09-14 ENCOUNTER — Ambulatory Visit: Payer: Managed Care, Other (non HMO) | Admitting: Family Medicine

## 2020-09-14 ENCOUNTER — Other Ambulatory Visit: Payer: Self-pay

## 2020-09-14 ENCOUNTER — Encounter: Payer: Self-pay | Admitting: Family Medicine

## 2020-09-14 VITALS — BP 124/80 | HR 74 | Temp 97.5°F | Resp 19 | Ht 62.0 in | Wt 143.0 lb

## 2020-09-14 DIAGNOSIS — M5412 Radiculopathy, cervical region: Secondary | ICD-10-CM

## 2020-09-14 DIAGNOSIS — M2550 Pain in unspecified joint: Secondary | ICD-10-CM | POA: Diagnosis not present

## 2020-09-14 DIAGNOSIS — M25512 Pain in left shoulder: Secondary | ICD-10-CM | POA: Diagnosis not present

## 2020-09-14 DIAGNOSIS — E785 Hyperlipidemia, unspecified: Secondary | ICD-10-CM | POA: Diagnosis not present

## 2020-09-14 MED ORDER — PREDNISONE 10 MG PO TABS: ORAL_TABLET | ORAL | 0 refills | Status: DC

## 2020-09-14 MED ORDER — CYCLOBENZAPRINE HCL 10 MG PO TABS: 10.0000 mg | ORAL_TABLET | Freq: Three times a day (TID) | ORAL | 0 refills | Status: AC | PRN

## 2020-09-14 NOTE — Patient Instructions (Addendum)
Schedule your complete physical in 6 months We'll notify you of your lab results and make any changes if needed START the Prednisone taper as directed- take w/ food Use the Flexeril as needed for spasm Call with any questions or concerns Hang in there!!!

## 2020-09-14 NOTE — Progress Notes (Signed)
   Subjective:    Patient ID: Kim Edwards, female    DOB: [birth year], 66 y.o.   MRN: [id number]  HPI Hyperlipidemia- chronic problem, on Crestor 20mg  and Fenofibrate 160mg  daily.  No CP, SOB, abd pain, N/V.  Cervical radiculopathy- R sided, sxs started 'a couple of months' ago.  Will radiate 'all the way down to my hand'.  L shoulder pain- sxs started ~6 weeks ago but has worsened over the last week after throwing a heavy trash bag.  L hand dominant.  Now not able to lift arm.  No grip weakness.  Polyarthralgia- 'I ache all the time'.  Hands will swell.  + family hx of RA.     Review of Systems For ROS see HPI   This visit occurred during the SARS-CoV-2 public health emergency.  Safety protocols were in place, including screening questions prior to the visit, additional usage of staff PPE, and extensive cleaning of exam room while observing appropriate contact time as indicated for disinfecting solutions.       Objective:   Physical Exam Vitals reviewed.  Constitutional:      General: She is not in acute distress.    Appearance: Normal appearance. She is well-developed. She is not ill-appearing.  HENT:     Head: Normocephalic and atraumatic.  Eyes:     Conjunctiva/sclera: Conjunctivae normal.     Pupils: Pupils are equal, round, and reactive to light.  Neck:     Thyroid: No thyromegaly.  Cardiovascular:     Rate and Rhythm: Normal rate and regular rhythm.     Pulses: Normal pulses.     Heart sounds: Normal heart sounds. No murmur heard.   Pulmonary:     Effort: Pulmonary effort is normal. No respiratory distress.     Breath sounds: Normal breath sounds.  Abdominal:     General: There is no distension.     Palpations: Abdomen is soft.     Tenderness: There is no abdominal tenderness.  Musculoskeletal:     Cervical back: Normal range of motion and neck supple. Tenderness (TTP over traps bilaterally) present.     Right lower leg: No edema.     Left lower leg: No  edema.     Comments: Decreased ROM of L shoulder- very limited forward flexion/abduction  Lymphadenopathy:     Cervical: No cervical adenopathy.  Skin:    General: Skin is warm and dry.  Neurological:     General: No focal deficit present.     Mental Status: She is alert and oriented to person, place, and time.  Psychiatric:        Mood and Affect: Mood normal.        Behavior: Behavior normal.           Assessment & Plan:  Cervical radiculopathy- new.  Palpable trap spasm on R.  Will start prednisone taper and muscle relaxer.  If no improvement will need referral to neurosurg  L shoulder pain- new.  She is L handed which makes her decreased ROM very limiting.  Pain acutely worsened after throwing heavy bag of trash last week.  'it's killing me'.  Start Prednisone and Flexeril and refer to Ortho.  Pt expressed understanding and is in agreement w/ plan.   Polyarthralgia- new.  Given family hx of RA will check labs to assess for RA and/or other autoimmune condition.

## 2020-09-14 NOTE — Assessment & Plan Note (Signed)
Chronic problem.  Tolerating Crestor and Fenofibrate w/o difficulty.  Check labs.  Adjust meds prn

## 2020-09-15 ENCOUNTER — Other Ambulatory Visit (INDEPENDENT_AMBULATORY_CARE_PROVIDER_SITE_OTHER): Payer: Managed Care, Other (non HMO)

## 2020-09-15 DIAGNOSIS — R7309 Other abnormal glucose: Secondary | ICD-10-CM | POA: Diagnosis not present

## 2020-09-15 LAB — LIPID PANEL
Cholesterol: 263 mg/dL — ABNORMAL HIGH (ref 0–200)
HDL: 73.2 mg/dL (ref >39.00)
Total CHOL/HDL Ratio: 4
Triglycerides: 466 mg/dL — ABNORMAL HIGH (ref 0.0–149.0)

## 2020-09-15 LAB — CBC WITH DIFFERENTIAL/PLATELET
Basophils Absolute: 0.1 10*3/uL (ref 0.0–0.1)
Basophils Relative: 1.2 % (ref 0.0–3.0)
Eosinophils Absolute: 0.2 10*3/uL (ref 0.0–0.7)
Eosinophils Relative: 2.7 % (ref 0.0–5.0)
HCT: 41.8 % (ref 36.0–46.0)
Hemoglobin: 14 g/dL (ref 12.0–15.0)
Lymphocytes Relative: 27.4 % (ref 12.0–46.0)
Lymphs Abs: 1.6 10*3/uL (ref 0.7–4.0)
MCHC: 33.5 g/dL (ref 30.0–36.0)
MCV: 95.9 fl (ref 78.0–100.0)
Monocytes Absolute: 0.4 10*3/uL (ref 0.1–1.0)
Monocytes Relative: 6.9 % (ref 3.0–12.0)
Neutro Abs: 3.7 10*3/uL (ref 1.4–7.7)
Neutrophils Relative %: 61.8 % (ref 43.0–77.0)
Platelets: 249 10*3/uL (ref 150.0–400.0)
RBC: 4.36 Mil/uL (ref 3.87–5.11)
RDW: 13.1 % (ref 11.5–15.5)
WBC: 6 10*3/uL (ref 4.0–10.5)

## 2020-09-15 LAB — BASIC METABOLIC PANEL
BUN: 13 mg/dL (ref 6–23)
CO2: 25 mEq/L (ref 19–32)
Calcium: 9.6 mg/dL (ref 8.4–10.5)
Chloride: 101 mEq/L (ref 96–112)
Creatinine, Ser: 0.74 mg/dL (ref 0.40–1.20)
GFR: 86.49 mL/min (ref >60.00)
Glucose, Bld: 126 mg/dL — ABNORMAL HIGH (ref 70–99)
Potassium: 4.1 mEq/L (ref 3.5–5.1)
Sodium: 140 mEq/L (ref 135–145)

## 2020-09-15 LAB — HEMOGLOBIN A1C: Hgb A1c MFr Bld: 6.8 % — ABNORMAL HIGH (ref 4.6–6.5)

## 2020-09-15 LAB — HEPATIC FUNCTION PANEL
ALT: 23 U/L (ref 0–35)
AST: 44 U/L — ABNORMAL HIGH (ref 0–37)
Albumin: 4.4 g/dL (ref 3.5–5.2)
Alkaline Phosphatase: 38 U/L — ABNORMAL LOW (ref 39–117)
Bilirubin, Direct: 0.2 mg/dL (ref 0.0–0.3)
Total Bilirubin: 1 mg/dL (ref 0.2–1.2)
Total Protein: 6.8 g/dL (ref 6.0–8.3)

## 2020-09-15 LAB — SEDIMENTATION RATE: Sed Rate: 14 mm/hr (ref 0–30)

## 2020-09-15 LAB — LDL CHOLESTEROL, DIRECT: Direct LDL: 144 mg/dL

## 2020-09-15 LAB — TSH: TSH: 3.25 u[IU]/mL (ref 0.35–4.50)

## 2020-09-15 LAB — ANA: Anti Nuclear Antibody (ANA): NEGATIVE

## 2020-09-15 LAB — RHEUMATOID FACTOR: Rheumatoid fact SerPl-aCnc: <14 IU/mL (ref <14)

## 2020-09-23 ENCOUNTER — Ambulatory Visit: Payer: Managed Care, Other (non HMO) | Admitting: Family

## 2020-09-23 ENCOUNTER — Other Ambulatory Visit: Payer: Managed Care, Other (non HMO)

## 2020-09-26 ENCOUNTER — Ambulatory Visit (INDEPENDENT_AMBULATORY_CARE_PROVIDER_SITE_OTHER): Payer: Managed Care, Other (non HMO)

## 2020-09-26 ENCOUNTER — Ambulatory Visit: Payer: Managed Care, Other (non HMO) | Admitting: Orthopaedic Surgery

## 2020-09-26 ENCOUNTER — Ambulatory Visit: Payer: Self-pay

## 2020-09-26 DIAGNOSIS — M25511 Pain in right shoulder: Secondary | ICD-10-CM

## 2020-09-26 DIAGNOSIS — G8929 Other chronic pain: Secondary | ICD-10-CM

## 2020-09-26 DIAGNOSIS — M25512 Pain in left shoulder: Secondary | ICD-10-CM

## 2020-09-26 DIAGNOSIS — M542 Cervicalgia: Secondary | ICD-10-CM | POA: Diagnosis not present

## 2020-09-26 MED ORDER — METHYLPREDNISOLONE ACETATE 40 MG/ML IJ SUSP
40.0000 mg | INTRAMUSCULAR | Status: AC | PRN
  Administered 2020-09-26: 40 mg via INTRA_ARTICULAR

## 2020-09-26 MED ORDER — LIDOCAINE HCL 1 % IJ SOLN
3.0000 mL | INTRAMUSCULAR | Status: AC | PRN
  Administered 2020-09-26: 3 mL

## 2020-09-26 NOTE — Progress Notes (Signed)
Office Visit Note   Patient: Kim Edwards           Date of Birth: 1955/07/04           MRN: [id number] Visit Date: 09/26/2020              Requested by: Midge Minium, MD [address] [place],  [place] [address] PCP: Midge Minium, MD   Assessment & Plan: Visit Diagnoses:  1. Chronic left shoulder pain   2. Chronic right shoulder pain   3. Neck pain     Plan: I did place a steroid injection in her left shoulder subacromial outlet and recommended that today and she agreed to this.  I described the risk and benefits of steroid injections.  Since she had such a good experience with Dr. Rolena Infante for her lumbar spine, I have recommend that she see him for her cervical spine and I will reach out to him as well.  She agrees with this treatment plan.  I showed her some shoulder exercises to try as well and I think this will help her.  All questions and concerns were answered and addressed.  Follow-up from my standpoint can be as needed.  If she has a hard time getting in with [facility] she will let me know.  Follow-Up Instructions: Return if symptoms worsen or fail to improve.   Orders:  Orders Placed This Encounter  Procedures  . Large Joint Inj  . XR Shoulder Left  . XR Cervical Spine 2 or 3 views  . XR Shoulder Right   No orders of the defined types were placed in this encounter.     Procedures: Large Joint Inj: L subacromial bursa on 09/26/2020 2:46 PM Indications: pain and diagnostic evaluation Details: 22 G 1.5 in needle  Arthrogram: No  Medications: 3 mL lidocaine 1 %; 40 mg methylPREDNISolone acetate 40 MG/ML Outcome: tolerated well, no immediate complications Procedure, treatment alternatives, risks and benefits explained, specific risks discussed. Consent was given by the patient. Immediately prior to procedure a time out was called to verify the correct patient, procedure, equipment, support staff and site/side marked as required. Patient was  prepped and draped in the usual sterile fashion.       Clinical Data: No additional findings.   Subjective: Chief Complaint  Patient presents with  . Left Shoulder - Pain  . Right Shoulder - Pain  . Neck - Pain  The patient is a very pleasant 65 year old female who comes in with left shoulder pain but also right-sided neck pain that radiates with numbness and tingling going down into her right hand.  She is left-hand dominant.  Somewhat I placed her on a steroid taper that did help some.  This is been going on for about 4 months.  She denies any injury to her left shoulder but is been having trouble reaching up and overhead with the left shoulder and reaching behind her.  The right shoulder moves fine she states but she is worried about the numbness and tingling going down her arm on the right side.  She actually has a remote history of lumbar spine surgery by Dr. Rolena Infante here in town and was very pleased with that surgery.  She is on chronic Xarelto due to some type of blood clot and potentially a PE.  She denies any breathing issues today.  She is now diabetic.  She cannot take anti-inflammatories due to being on blood thinning medication.  HPI  Review of Systems There is currently listed no headache, chest pain, shortness of breath, fever, chills, nausea, vomiting  Objective: Vital Signs: LMP 10/02/2010   Physical Exam She is alert and orient x3 and in no acute distress Ortho Exam Examination of her right shoulder shows it moves smoothly and fluidly throughout its arc of motion and has no weakness.  Examination of her left shoulder shows positive signs of impingement.  Her liftoff is negative and her rotator cuff is strong but past 90 degrees of abduction she hurts all around the subacromial outlet.  She does have neck pain to the right side with lateral rotation and bending and a positive Spurling sign to the right side.  She has some weaker grip strength on the right nondominant side  the left side with some subjective numbness in C6 area. Specialty Comments:  No specialty comments available.  Imaging: XR Cervical Spine 2 or 3 views  Result Date: 09/26/2020 2 views of the cervical spine shows severe degenerative disc disease between C4 and C6.  There is loss of disc space at all those levels.  There are osteophytes anterior and posterior as well as loss of cervical lordosis.  XR Shoulder Left  Result Date: 09/26/2020 3 views left shoulder show no acute findings.  The shoulder is well located.  XR Shoulder Right  Result Date: 09/26/2020 3 views of the right shoulder show well located shoulder with no acute findings.    PMFS History: Patient Active Problem List   Diagnosis Date Noted  . Overweight (BMI 25.0-29.9) 07/06/2019  . S/P lumbar fusion 02/26/2019  . Vitamin D deficiency 10/09/2017  . Screening for malignant neoplasm of cervix 08/24/2013  . Physical exam 08/24/2013  . Right lateral epicondylitis 08/24/2013  . Ulnar nerve compression 02/22/2012  . Lumbar radiculopathy 02/22/2012  . Abnormal LFTs 07/26/2010  . SHOULDER, PAIN 06/22/2010  . Hyperlipidemia 05/24/2010  . Anxiety and depression 05/24/2010  . PERIMENOPAUSAL SYNDROME 05/24/2010  . CERVICAL CANCER, HX OF 05/24/2010   Past Medical History:  Diagnosis Date  . Chronic back pain   . Degeneration of lumbar intervertebral disc 11/06/2017  . 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 History of cervical cancer 1988  . Hyperlipidemia   . Joint pain   . Numbness and tingling of right leg     Family History  Problem Relation Age of Onset  . Heart disease Mother        quad bypass  . Diabetes Mother   . Heart attack Mother   . Heart disease Father        bypass x2  . Diabetes Father   . Heart attack Father   . Hypertension Father   . Colon cancer Neg Hx     Past Surgical History:  Procedure Laterality Date  . ABDOMINAL EXPOSURE N/A 02/26/2019    Procedure: ABDOMINAL EXPOSURE;  Surgeon: Rosetta Posner, MD;  [facility] OR;  Service: Vascular;  Laterality: N/A;  . ANTERIOR LUMBAR FUSION N/A 02/26/2019   Procedure: Anterior lumbar fusion L4-5, Right far lateral disectomy;  Surgeon: Melina Schools, MD;  Location: [facility];  Service: Orthopedics;  Laterality: N/A;  . EYE SURGERY     age 28  . HAND SURGERY     polypectomy  . LASIK  04/24/2015  . LUMBAR LAMINECTOMY/DECOMPRESSION MICRODISCECTOMY Right 02/26/2019   Procedure: LUMBAR LAMINECTOMY/DECOMPRESSION MICRODISCECTOMY L4-5 Right far lateral disectomy;  Surgeon: Melina Schools, MD;  Location: [facility];  Service: Orthopedics;  Laterality: Right;   Social History   Occupational History  . Not on file  Tobacco Use  . Smoking status: Former Smoker    Quit date: 04/23/2000    Years since quitting: 20.4  . Smokeless tobacco: Never Used  Vaping Use  . Vaping Use: Never used  Substance and Sexual Activity  . Alcohol use: Yes    Alcohol/week: 0.0 standard drinks    Comment: 1-1.5 glasses of wine daily  . Drug use: No  . Sexual activity: Not on file

## 2020-10-14 ENCOUNTER — Other Ambulatory Visit: Payer: Self-pay

## 2020-10-14 ENCOUNTER — Telehealth: Payer: Self-pay

## 2020-10-14 DIAGNOSIS — E785 Hyperlipidemia, unspecified: Secondary | ICD-10-CM

## 2020-10-14 MED ORDER — ROSUVASTATIN CALCIUM 20 MG PO TABS: 20.0000 mg | ORAL_TABLET | Freq: Every day | ORAL | 0 refills | Status: DC

## 2020-10-14 MED ORDER — FENOFIBRATE 160 MG PO TABS: 160.0000 mg | ORAL_TABLET | Freq: Every day | ORAL | 0 refills | Status: DC

## 2020-10-14 NOTE — Telephone Encounter (Signed)
Pt needs refill on rosuvastatin (CRESTOR) 20 MG tablet , fenofibrate 160 MG tablet [facility] [id number] - [place], [place] [address], [place] [address]   Pt call back [phone]

## 2020-10-14 NOTE — Telephone Encounter (Signed)
Rx filled and sent to patient pharmacy 

## 2020-10-18 ENCOUNTER — Other Ambulatory Visit: Payer: Self-pay | Admitting: Family

## 2020-10-18 ENCOUNTER — Other Ambulatory Visit (HOSPITAL_BASED_OUTPATIENT_CLINIC_OR_DEPARTMENT_OTHER): Payer: Self-pay

## 2020-10-18 DIAGNOSIS — I2602 Saddle embolus of pulmonary artery with acute cor pulmonale: Secondary | ICD-10-CM

## 2020-10-18 DIAGNOSIS — D6859 Other primary thrombophilia: Secondary | ICD-10-CM

## 2020-10-18 DIAGNOSIS — R760 Raised antibody titer: Secondary | ICD-10-CM

## 2020-10-18 MED ORDER — RIVAROXABAN 20 MG PO TABS
ORAL_TABLET | Freq: Every day | ORAL | 4 refills | Status: DC
  Filled 2020-10-18: qty 30, 30d supply, fill #0
  Filled 2020-12-16: qty 30, 30d supply, fill #1
  Filled 2021-02-28: qty 30, 30d supply, fill #2
  Filled 2021-05-23: qty 30, 30d supply, fill #3
  Filled 2021-09-01: qty 30, 30d supply, fill #4

## 2020-10-19 ENCOUNTER — Encounter: Payer: Self-pay | Admitting: *Deleted

## 2020-12-16 ENCOUNTER — Other Ambulatory Visit: Payer: Self-pay

## 2020-12-16 ENCOUNTER — Other Ambulatory Visit (HOSPITAL_BASED_OUTPATIENT_CLINIC_OR_DEPARTMENT_OTHER): Payer: Self-pay

## 2020-12-16 ENCOUNTER — Telehealth: Payer: Self-pay | Admitting: Family Medicine

## 2020-12-16 DIAGNOSIS — E785 Hyperlipidemia, unspecified: Secondary | ICD-10-CM

## 2020-12-16 MED ORDER — FENOFIBRATE 160 MG PO TABS: 160.0000 mg | ORAL_TABLET | Freq: Every day | ORAL | 0 refills | Status: DC

## 2020-12-16 NOTE — Telephone Encounter (Signed)
..  Caller name:Dariella Psychologist, forensic callback [phone]  Encourage patient to contact the pharmacy for refills or they can request refills through [place]:  (Please schedule appointment if longer than 1 year)  NEXT APPOINTMENT DATE:   MEDICATION NAME & DOSE:  Fenofibrate 160 mg tablet, Rosuvastatin calcium 20 mg.  Is the patient out of medication? YES/NO: Yes   [facility] [address] in [place]  Let patient know to contact pharmacy at the end of the day to make sure medication is ready.  Please notify patient to allow 48-72 hours to process  (CLINICAL TO FILL OR ROUTE PER PROTOCOLS)

## 2020-12-16 NOTE — Telephone Encounter (Signed)
Medication sent to pharmacy  

## 2020-12-19 ENCOUNTER — Other Ambulatory Visit: Payer: Self-pay

## 2020-12-19 DIAGNOSIS — E785 Hyperlipidemia, unspecified: Secondary | ICD-10-CM

## 2020-12-19 MED ORDER — ROSUVASTATIN CALCIUM 20 MG PO TABS: 20.0000 mg | ORAL_TABLET | Freq: Every day | ORAL | 0 refills | Status: DC

## 2021-01-22 ENCOUNTER — Encounter: Payer: Self-pay | Admitting: Gastroenterology

## 2021-02-16 ENCOUNTER — Encounter: Payer: Self-pay | Admitting: *Deleted

## 2021-02-16 DIAGNOSIS — I2699 Other pulmonary embolism without acute cor pulmonale: Secondary | ICD-10-CM | POA: Insufficient documentation

## 2021-02-28 ENCOUNTER — Other Ambulatory Visit (HOSPITAL_BASED_OUTPATIENT_CLINIC_OR_DEPARTMENT_OTHER): Payer: Self-pay

## 2021-03-14 ENCOUNTER — Other Ambulatory Visit: Payer: Self-pay

## 2021-03-14 DIAGNOSIS — E785 Hyperlipidemia, unspecified: Secondary | ICD-10-CM

## 2021-03-14 MED ORDER — FENOFIBRATE 160 MG PO TABS: 160.0000 mg | ORAL_TABLET | Freq: Every day | ORAL | 0 refills | Status: DC

## 2021-05-23 ENCOUNTER — Other Ambulatory Visit (HOSPITAL_BASED_OUTPATIENT_CLINIC_OR_DEPARTMENT_OTHER): Payer: Self-pay

## 2021-06-23 ENCOUNTER — Other Ambulatory Visit: Payer: Self-pay

## 2021-06-23 DIAGNOSIS — E785 Hyperlipidemia, unspecified: Secondary | ICD-10-CM

## 2021-06-23 MED ORDER — ROSUVASTATIN CALCIUM 20 MG PO TABS: 20.0000 mg | ORAL_TABLET | Freq: Every day | ORAL | 0 refills | Status: DC

## 2021-08-31 ENCOUNTER — Encounter: Payer: Self-pay | Admitting: Family Medicine

## 2021-08-31 ENCOUNTER — Telehealth (INDEPENDENT_AMBULATORY_CARE_PROVIDER_SITE_OTHER): Payer: Managed Care, Other (non HMO) | Admitting: Family Medicine

## 2021-08-31 DIAGNOSIS — F32A Depression, unspecified: Secondary | ICD-10-CM | POA: Diagnosis not present

## 2021-08-31 DIAGNOSIS — F419 Anxiety disorder, unspecified: Secondary | ICD-10-CM

## 2021-08-31 MED ORDER — TRAZODONE HCL 50 MG PO TABS: ORAL_TABLET | ORAL | 3 refills | Status: DC

## 2021-08-31 MED ORDER — FLUOXETINE HCL 10 MG PO TABS: 10.0000 mg | ORAL_TABLET | Freq: Every day | ORAL | 3 refills | Status: DC

## 2021-08-31 MED ORDER — ALPRAZOLAM 0.5 MG PO TABS: 0.5000 mg | ORAL_TABLET | Freq: Two times a day (BID) | ORAL | 1 refills | Status: DC | PRN
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
## 2021-09-01 ENCOUNTER — Other Ambulatory Visit (HOSPITAL_BASED_OUTPATIENT_CLINIC_OR_DEPARTMENT_OTHER): Payer: Self-pay

## 2021-09-25 ENCOUNTER — Other Ambulatory Visit (HOSPITAL_COMMUNITY): Payer: Self-pay

## 2021-10-03 ENCOUNTER — Encounter: Payer: Self-pay | Admitting: Family Medicine

## 2021-10-03 ENCOUNTER — Ambulatory Visit: Payer: Managed Care, Other (non HMO) | Admitting: Family Medicine

## 2021-10-03 VITALS — BP 116/80 | HR 123 | Temp 98.1°F | Resp 16 | Ht 62.0 in | Wt 135.1 lb

## 2021-10-03 DIAGNOSIS — F419 Anxiety disorder, unspecified: Secondary | ICD-10-CM | POA: Diagnosis not present

## 2021-10-03 DIAGNOSIS — F32A Depression, unspecified: Secondary | ICD-10-CM | POA: Diagnosis not present

## 2021-10-03 MED ORDER — FLUOXETINE HCL 20 MG PO CAPS: 20.0000 mg | ORAL_CAPSULE | Freq: Every day | ORAL | 3 refills | Status: AC

## 2021-10-03 NOTE — Patient Instructions (Signed)
Follow up in 1 month to see how things are going INCREASE your Fluoxetine to '20mg'$  daily- 2 of what you have at home and 1 of the new prescription USE the Alprazolam as needed for those high stress/panicked moments Make sure you are in a safe place!  The house is not nearly as important as you are! None of this is your fault!  You can't blame yourself! Call with any questions or concerns Hang in there!!!
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
## 2021-10-04 DIAGNOSIS — Z0279 Encounter for issue of other medical certificate: Secondary | ICD-10-CM

## 2021-10-05 ENCOUNTER — Telehealth: Payer: Self-pay

## 2021-10-05 NOTE — Telephone Encounter (Signed)
FMLA forms are faxed copies placed for scan and handed Juliann Pulse the original due to a charge sheet attached .

## 2021-10-20 ENCOUNTER — Telehealth: Payer: Self-pay | Admitting: Family Medicine

## 2021-10-20 NOTE — Telephone Encounter (Signed)
Pt called stating she need her medical records to be fax to [facility]. Fax number is [phone].

## 2021-10-23 NOTE — Telephone Encounter (Signed)
Kim Edwards,   This is a good example of a message that needs a little more info. What's needing to be sent and why? What Medical records? Any certain ones or all of them? She may need to contact medical records if more than just an office visit or so is needed. Let me know how I can help.

## 2021-10-25 NOTE — Telephone Encounter (Signed)
I have the forms and placed them in Dr Carlota Raspberry folder to be signed while Dr Birdie Riddle is out of office

## 2021-11-01 NOTE — Telephone Encounter (Signed)
Short-term disability paperwork received in PCPs absence.  Office visit June 13.  Prior notes and FMLA paperwork reviewed for completion of paperwork to the best of my ability.  If further information needed, may need to be provided by Dr. Birdie Riddle when she returns.  Paperwork ready for fax.

## 2021-11-02 NOTE — Telephone Encounter (Signed)
I have faxed forms back to the # 1 [phone]. I have made for pt chart as well . Notified the pt that the forms where faxed .

## 2021-11-06 ENCOUNTER — Other Ambulatory Visit (HOSPITAL_BASED_OUTPATIENT_CLINIC_OR_DEPARTMENT_OTHER): Payer: Self-pay

## 2021-11-06 ENCOUNTER — Other Ambulatory Visit: Payer: Self-pay | Admitting: Hematology & Oncology

## 2021-11-06 DIAGNOSIS — R760 Raised antibody titer: Secondary | ICD-10-CM

## 2021-11-06 DIAGNOSIS — I2602 Saddle embolus of pulmonary artery with acute cor pulmonale: Secondary | ICD-10-CM

## 2021-11-06 DIAGNOSIS — D6859 Other primary thrombophilia: Secondary | ICD-10-CM

## 2021-11-07 ENCOUNTER — Other Ambulatory Visit (HOSPITAL_BASED_OUTPATIENT_CLINIC_OR_DEPARTMENT_OTHER): Payer: Self-pay

## 2021-11-07 MED ORDER — RIVAROXABAN 20 MG PO TABS
ORAL_TABLET | Freq: Every day | ORAL | 4 refills | Status: DC
  Filled 2021-11-07: qty 30, 30d supply, fill #0

## 2021-11-13 ENCOUNTER — Telehealth: Payer: Self-pay | Admitting: Family Medicine

## 2021-11-13 DIAGNOSIS — R760 Raised antibody titer: Secondary | ICD-10-CM

## 2021-11-13 DIAGNOSIS — D6859 Other primary thrombophilia: Secondary | ICD-10-CM

## 2021-11-13 DIAGNOSIS — I2602 Saddle embolus of pulmonary artery with acute cor pulmonale: Secondary | ICD-10-CM

## 2021-11-13 MED ORDER — ALPRAZOLAM 0.5 MG PO TABS: 0.5000 mg | ORAL_TABLET | Freq: Two times a day (BID) | ORAL | 1 refills | Status: AC | PRN

## 2021-11-13 MED ORDER — RIVAROXABAN 20 MG PO TABS: ORAL_TABLET | Freq: Every day | ORAL | 4 refills | Status: DC

## 2021-11-13 NOTE — Telephone Encounter (Signed)
I sent both the Alprazolam and the Xarelto to the pharmacy in [place].  Dr Marin Olp did send the refill last week but it went to her local pharmacy rather than where she is.

## 2021-11-13 NOTE — Telephone Encounter (Signed)
Caller name: Kiauna Zywicki  On DPR? :yes/no: Yes  Call back number: [phone]  Provider they see: Birdie Riddle   Reason for call:  Pt called stating that she is in [facility] right now and she needs a refill on her alprazolam 0.5 mg. Pt want medication to be  sent to [facility] [address] [place], [place] [address].   Pt states that she finished her Xarelto Starter Pack and she wants to continue take medication.    Please advise

## 2021-11-13 NOTE — Telephone Encounter (Signed)
Prescriptions sent to pharmacy.  Please let her know Dr Marin Olp sent in her Xarelto last week but he sent it to the local pharmacy.  I sent both to [facility].

## 2021-12-13 ENCOUNTER — Telehealth: Payer: Self-pay | Admitting: Family Medicine

## 2021-12-13 NOTE — Telephone Encounter (Signed)
Appears this is FYI right now but should I advise a virtual given stress state?

## 2021-12-13 NOTE — Telephone Encounter (Signed)
Caller name: Fernande Boyden (sister)  On DPR? :yes/no: Yes  Call back number: [phone]  Provider they see: Birdie Riddle  Reason for call: States that pt has been "going through a lot" recently and is suffering from severe depression and anxiety; states that pt recently moved to an appartment in [facility]; states that pt shut herself in her room; did not state that door was locked; just that pt would not come out of room. Called the nurse line; explained the situation. Pt hung up; gave nurse pt's # [phone]; nurse stated that she would call pt to further discuss situation.
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
## 2021-12-14 ENCOUNTER — Telehealth: Payer: Self-pay | Admitting: Family Medicine

## 2021-12-14 NOTE — Telephone Encounter (Signed)
Attempted to call the patient Kim Edwards for her to return the call.

## 2021-12-14 NOTE — Telephone Encounter (Signed)
Pt has multiple options given suicidal thoughts  1) [facility]  on [address] [place].  Open 24 hrs 2) [facility] on [address], [place].  Open 24 hrs 3) Emergency room  Last choice would be office visit as we may not have the resources to help at the level she needs.  But an OV is better than not seeking care at all.

## 2021-12-14 NOTE — Telephone Encounter (Signed)
Given the complicated nature of this call, I would prefer she schedule a virtual visit so we can go through everything and document appropriately.  She can schedule at her convenience (and ok to use same day if needed)

## 2021-12-14 NOTE — Telephone Encounter (Signed)
Did advise pt that Dr. Birdie Riddle was in clinic with pts when she called.

## 2021-12-14 NOTE — Telephone Encounter (Signed)
Caller name: Jaquasia   On DPR? :yes/no: Yes  Call back number: [phone]  Provider they see:  Birdie Riddle  Reason for call: Pt returning call about mental health issues; wants to talk to Dr. Birdie Riddle specifically. Pls advise.

## 2021-12-14 NOTE — Telephone Encounter (Signed)
Attempted to call pt but no answer and no vm set up at this time .

## 2021-12-19 ENCOUNTER — Telehealth: Payer: Self-pay | Admitting: Family Medicine

## 2021-12-19 NOTE — Telephone Encounter (Signed)
Caller name: Carlyon  On DPR? :yes/no: Yes  Call back number: [phone]  Provider they see: Birdie Riddle  Reason for call: pt calling to get a release to go back to work. Please advise.

## 2021-12-19 NOTE — Telephone Encounter (Signed)
Is pt able to do a virtual visit?  I want to make sure she is doing ok before heading back into a stressful work environment

## 2021-12-19 NOTE — Telephone Encounter (Signed)
Scheduled a virtual for Tuesday 12/26/21

## 2021-12-21 ENCOUNTER — Telehealth: Payer: Self-pay

## 2021-12-26 ENCOUNTER — Telehealth (INDEPENDENT_AMBULATORY_CARE_PROVIDER_SITE_OTHER): Payer: Managed Care, Other (non HMO) | Admitting: Family Medicine

## 2021-12-26 ENCOUNTER — Encounter: Payer: Self-pay | Admitting: Family Medicine

## 2021-12-26 DIAGNOSIS — F419 Anxiety disorder, unspecified: Secondary | ICD-10-CM | POA: Diagnosis not present

## 2021-12-26 DIAGNOSIS — F32A Depression, unspecified: Secondary | ICD-10-CM
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
## 2021-12-27 NOTE — Telephone Encounter (Signed)
Dr Birdie Riddle has forms to be filled out

## 2021-12-28 ENCOUNTER — Telehealth: Payer: Self-pay | Admitting: Family Medicine

## 2021-12-28 NOTE — Telephone Encounter (Signed)
Dr Birdie Riddle has the forms . Had video visit with pt on Tuesday 12/26/21. Once she fills the forms out I will fax back

## 2021-12-28 NOTE — Telephone Encounter (Signed)
[employer] called wanting update of form for patient

## 2022-01-01 NOTE — Telephone Encounter (Signed)
Faxed

## 2022-01-01 NOTE — Telephone Encounter (Signed)
Forms completed and given to [facility] to fax

## 2022-01-01 NOTE — Telephone Encounter (Signed)
Did you finish these forms? I am happy to fax while Marcille Blanco is out of office

## 2022-01-01 NOTE — Telephone Encounter (Signed)
Pt called to check on the status of forms from [place].  Pt states that they need to be sent ASAP b/c until they are completed, she is not getting paid.

## 2022-01-02 NOTE — Telephone Encounter (Signed)
Spoke w/ pt; states that [employer] told her fax still not rec'd; re-faxed and advised pt.

## 2022-01-11 ENCOUNTER — Telehealth: Payer: Self-pay | Admitting: Family Medicine

## 2022-01-11 NOTE — Telephone Encounter (Signed)
Short Term Disability paperwork from [place] rec'd and put in box to go back to Dr. Birdie Riddle

## 2022-01-12 ENCOUNTER — Telehealth: Payer: Self-pay

## 2022-01-12 DIAGNOSIS — Z0279 Encounter for issue of other medical certificate: Secondary | ICD-10-CM

## 2022-01-12 NOTE — Telephone Encounter (Signed)
Forms signed and given to [facility]

## 2022-01-12 NOTE — Telephone Encounter (Signed)
Forms where placed in Dr Birdie Riddle to be signed folder . Once signed I will fax back and notify the pt

## 2022-01-12 NOTE — Telephone Encounter (Signed)
Forms have been faxed to [place] [employer] .

## 2022-01-12 NOTE — Telephone Encounter (Signed)
Sending to you for documentation when complete

## 2022-01-16 DIAGNOSIS — Z0279 Encounter for issue of other medical certificate: Secondary | ICD-10-CM

## 2022-01-24 ENCOUNTER — Telehealth: Payer: Self-pay

## 2022-01-24 NOTE — Telephone Encounter (Signed)
Will place in Dr Birdie Riddle to be signed folder once signed I will fax back , place in scan and notify the pt

## 2022-01-24 NOTE — Telephone Encounter (Signed)
Faxed forms back and notified the pt that the forms have been faxed

## 2022-01-24 NOTE — Telephone Encounter (Signed)
Received disability forms for patient. Placed in Dr Virgil Benedict bin for completion.

## 2022-02-01 NOTE — Telephone Encounter (Signed)
New forms from [place] have been placed in Dr Birdie Riddle to be signed folder . Once signed I will fax back

## 2022-02-05 NOTE — Telephone Encounter (Signed)
Forms faxed and put in scan

## 2022-02-05 NOTE — Telephone Encounter (Signed)
No new forms have arrived- just the face sheet.

## 2022-02-09 ENCOUNTER — Telehealth: Payer: Self-pay | Admitting: Family Medicine

## 2022-02-09 NOTE — Telephone Encounter (Signed)
Placed forms in Dr Tabori to be signed folder once signed I will fax back  

## 2022-02-09 NOTE — Telephone Encounter (Signed)
Received forms from [place] regarding this pt. Placed in front bin with charge sheet.

## 2022-02-13 NOTE — Telephone Encounter (Signed)
These same forms were completed on 01/12/22.  They are scanned into the media section of the chart.  No need to fill these out again as nothing has changed

## 2022-03-05 ENCOUNTER — Other Ambulatory Visit: Payer: Self-pay

## 2022-03-05 DIAGNOSIS — E785 Hyperlipidemia, unspecified: Secondary | ICD-10-CM

## 2022-03-05 MED ORDER — ROSUVASTATIN CALCIUM 20 MG PO TABS: 20.0000 mg | ORAL_TABLET | Freq: Every day | ORAL | 0 refills | Status: AC

## 2022-03-05 MED ORDER — FENOFIBRATE 160 MG PO TABS: 160.0000 mg | ORAL_TABLET | Freq: Every day | ORAL | 0 refills | Status: AC

## 2022-05-11 ENCOUNTER — Other Ambulatory Visit: Payer: Self-pay

## 2022-05-11 DIAGNOSIS — I2602 Saddle embolus of pulmonary artery with acute cor pulmonale: Secondary | ICD-10-CM

## 2022-05-11 DIAGNOSIS — R760 Raised antibody titer: Secondary | ICD-10-CM

## 2022-05-11 DIAGNOSIS — D6859 Other primary thrombophilia: Secondary | ICD-10-CM

## 2022-05-11 MED ORDER — RIVAROXABAN 20 MG PO TABS: ORAL_TABLET | Freq: Every day | ORAL | 1 refills | Status: DC

## 2022-05-15 ENCOUNTER — Other Ambulatory Visit: Payer: Self-pay

## 2022-05-15 DIAGNOSIS — D6859 Other primary thrombophilia: Secondary | ICD-10-CM

## 2022-05-15 DIAGNOSIS — I2602 Saddle embolus of pulmonary artery with acute cor pulmonale: Secondary | ICD-10-CM

## 2022-05-15 DIAGNOSIS — R760 Raised antibody titer: Secondary | ICD-10-CM

## 2022-05-15 MED ORDER — RIVAROXABAN 20 MG PO TABS: ORAL_TABLET | Freq: Every day | ORAL | 1 refills | Status: AC

## 2022-07-12 IMAGING — CT CT NECK W/ CM
4 of 5 series · 14 of 33 positions shown, 16 images · IV contrast (omnipaque)
Comparison: CT Chest, Abdomen, and Pelvis today are reported
separately.

CLINICAL DATA: 64-year-old female with unexplained face and neck
swelling for 5 weeks. History of cervical cancer, ACDF. Query
foreign body.

EXAM:
CT NECK WITH CONTRAST
TECHNIQUE: Multidetector CT imaging of the neck was performed using the
standard protocol following the bolus administration of intravenous
contrast.
CONTRAST:  100mL OMNIPAQUE IOHEXOL 300 MG/ML  SOLN

[Series 2: axial neck · axial · 0.51mm/px · z∈[-272,-160]mm · 3 of 112 slices shown, 4 images]
[im 28/112  soft-tissue]
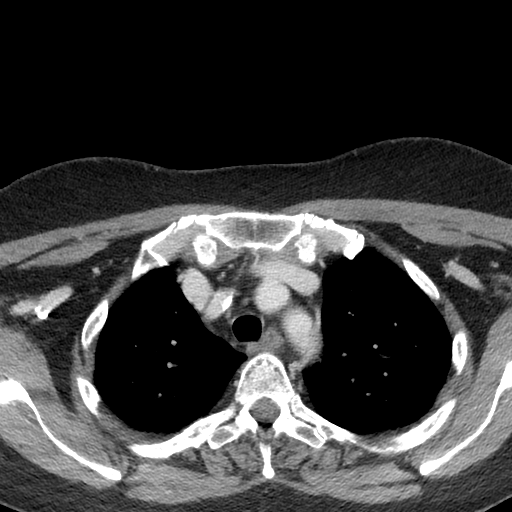
[im 28/112  bone]
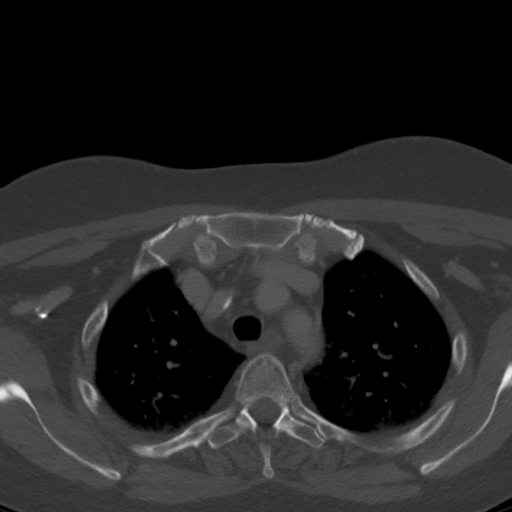
[im 56/112  bone]
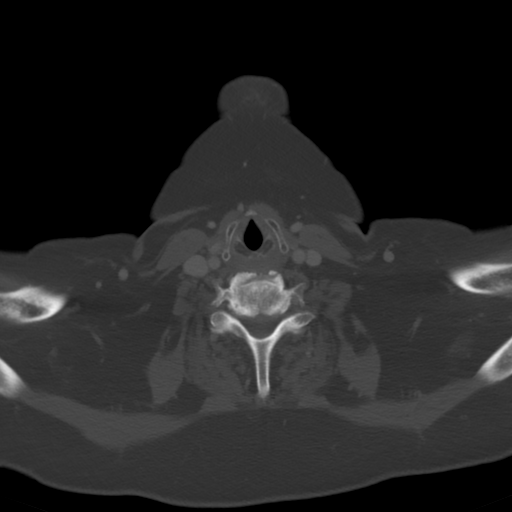
[im 84/112  bone]
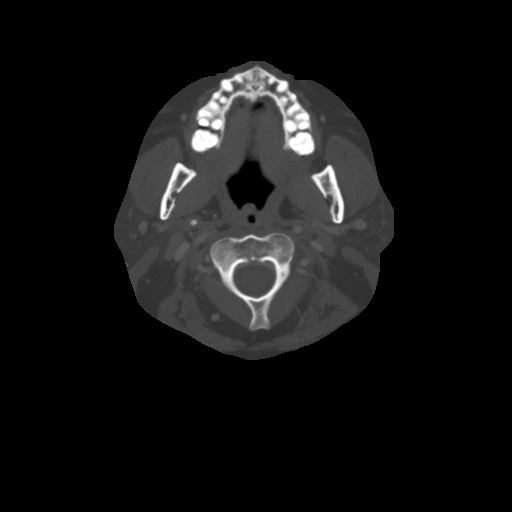

[Series 5: sag neck · sagittal · 0.44mm/px · 5 of 101 slices shown, 6 images]
[im 34/101  bone]
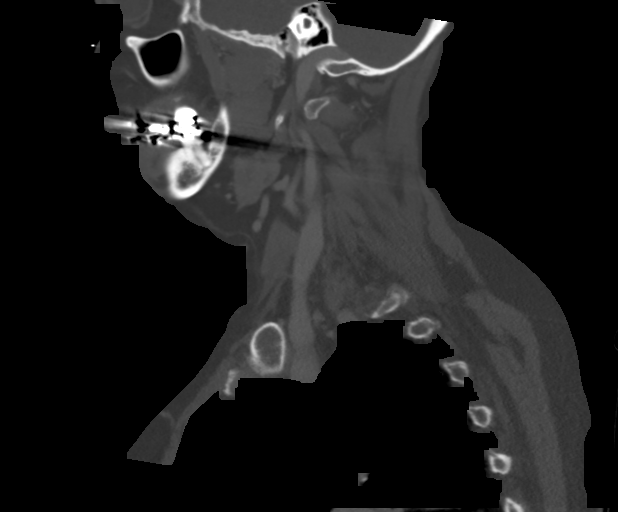
[im 42/101  bone]
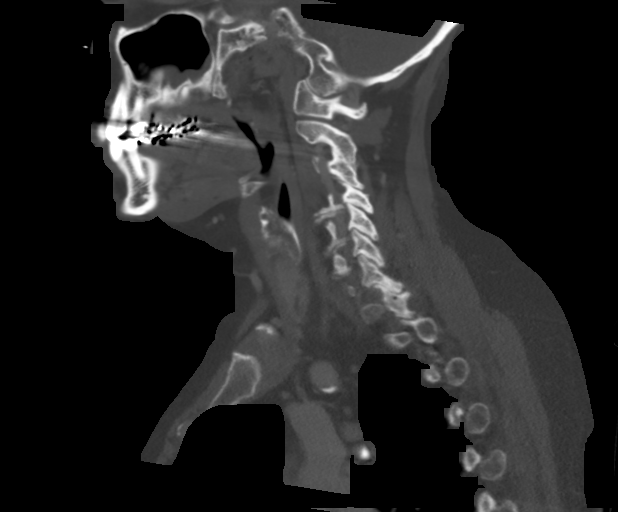
[im 51/101  soft-tissue]
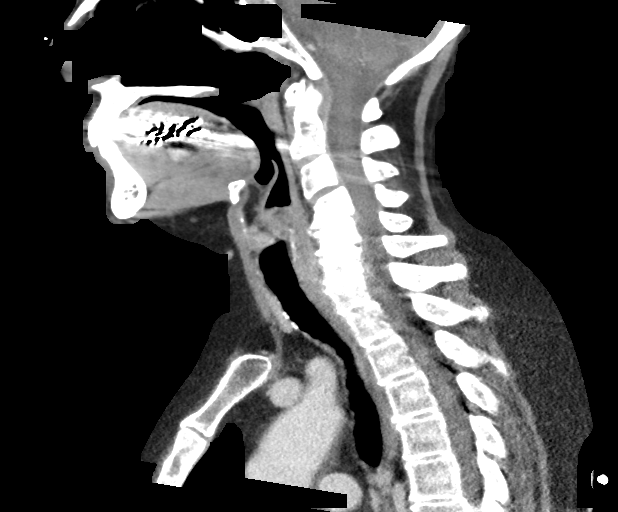
[im 51/101  bone]
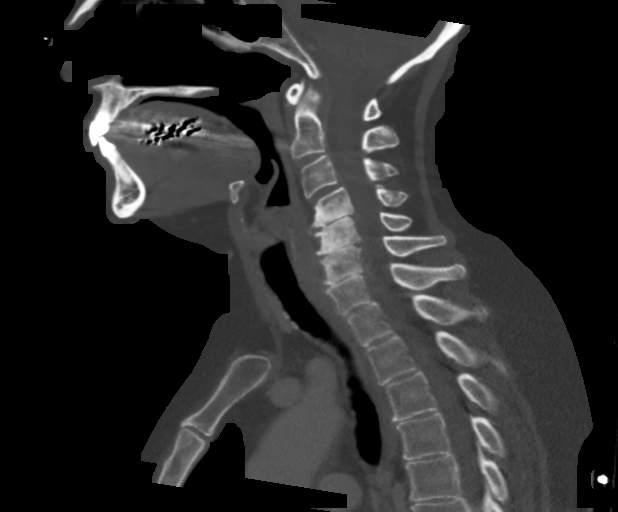
[im 59/101  bone]
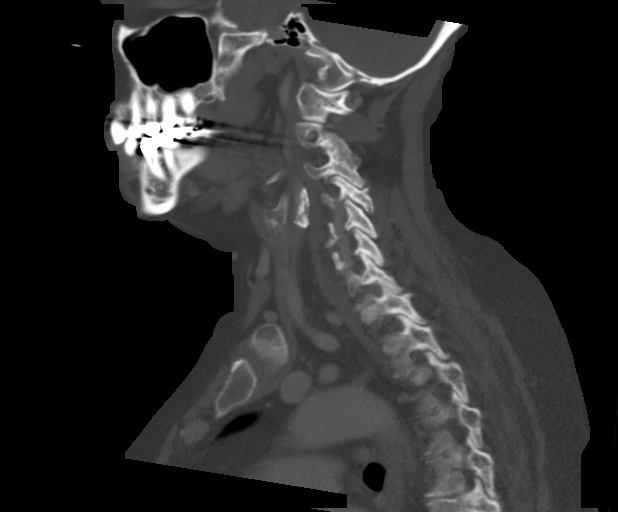
[im 67/101  bone]
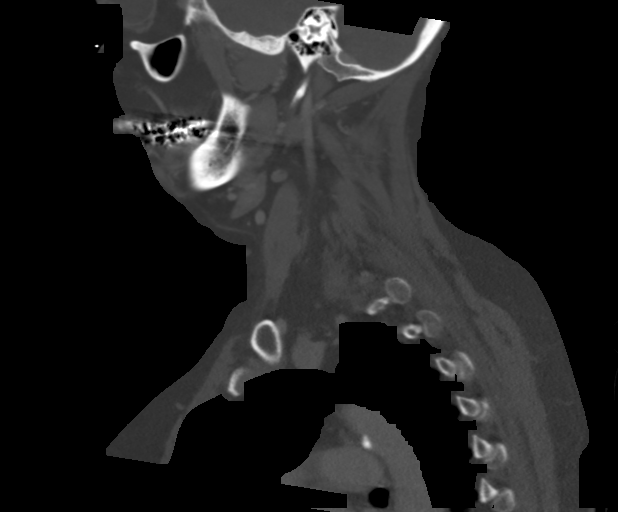

[Series 6: cor neck · coronal · 0.44mm/px · 3 of 101 slices shown]
[im 25/101  bone]
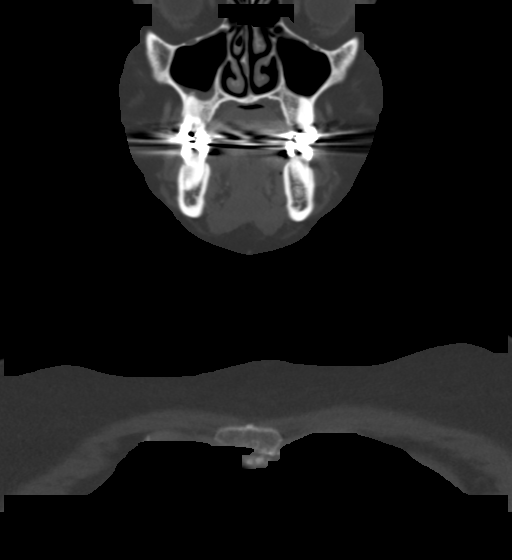
[im 42/101  bone]
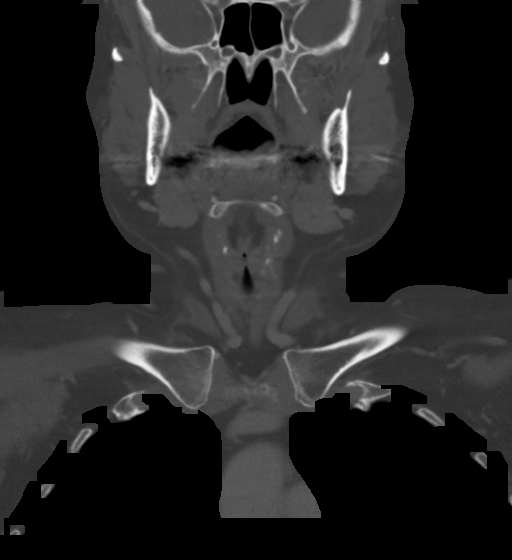
[im 59/101  bone]
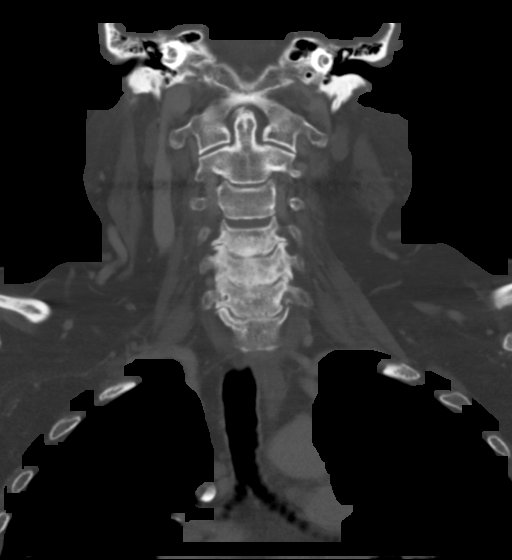

[Series 7: orthogonal (person_name) · axial · 0.39mm/px · z∈[-309,-191]mm · 3 of 124 slices shown]
[im 31/124  bone]
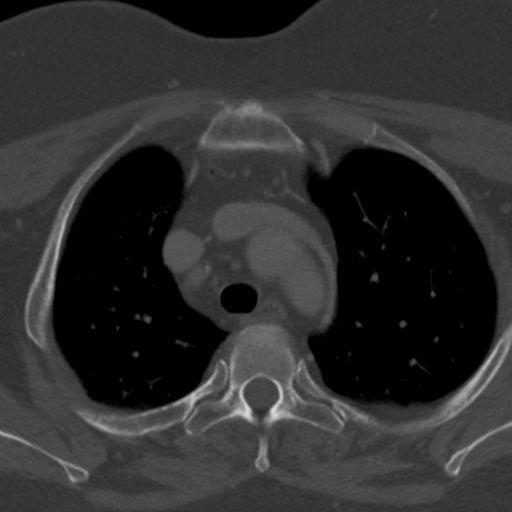
[im 62/124  bone]
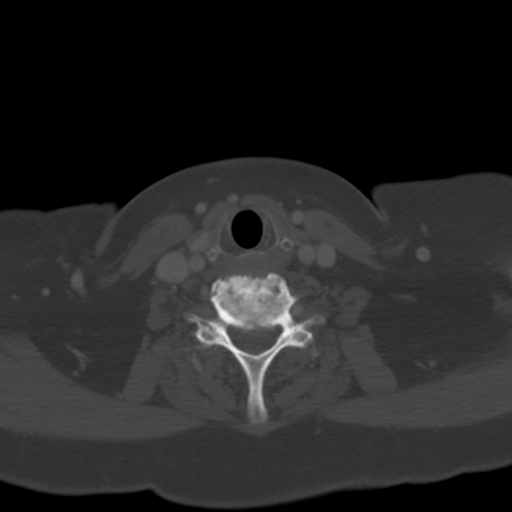
[im 93/124  bone]
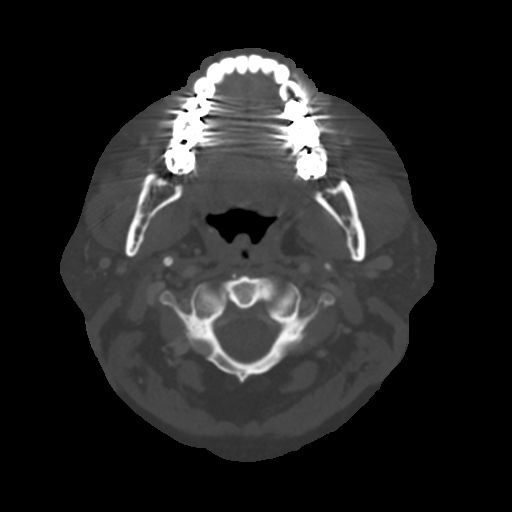

[14 of 33 positions shown; findings below may reference images not displayed]

FINDINGS: Pharynx and larynx: The glottis is closed. Laryngeal and pharyngeal
soft tissue contours are within normal limits. Negative
parapharyngeal and retropharyngeal spaces.

Salivary glands: Negative sublingual space. Submandibular glands and
parotid glands are within normal limits.

Thyroid: Diminutive, negative.

Lymph nodes: Negative.  No cervical lymphadenopathy.

No focal soft tissue inflammation identified in the neck.

No radiopaque foreign body identified.

Vascular: The major vascular structures in the neck and at the skull
base are patent including bilateral internal and external jugular
veins. There is severe atherosclerosis of the proximal left ICA,
with evidence of hemodynamically significant stenosis at the distal
bulb on series 2, image 43. Left vertebral artery appears dominant.

Limited intracranial: Negative.

Visualized orbits: Negative.

Mastoids and visualized paranasal sinuses: Well pneumatized. Right
maxillary sinus alveolar recess mucosal thickening, associated with
some posterior right maxillary molar periapical dental lucency.

Skeleton: No cervical ACDF hardware is present. There is chronic
appearing ankylosis of the posterior C2-C3 facets, and severe
chronic disc and endplate degeneration C4 through C6 with
degenerative appearing vertebral body sclerosis at those levels.

No acute or suspicious osseous lesion identified.

Upper chest: Reported separately today.
IMPRESSION: 1. No acute or metastatic process identified in the neck. No foreign
body identified.
2. See also Chest CT today reported separately.
3. Severe atherosclerosis of the l next case eft ICA.
Hemodynamically significant stenosis suspected at the distal bulb.
Follow-up Carotid Doppler Ultrasound may be valuable.

## 2022-11-23 ENCOUNTER — Telehealth: Payer: Self-pay | Admitting: Family Medicine

## 2022-11-23 NOTE — Transitions of Care (Post Inpatient/ED Visit) (Signed)
   11/23/2022  Name: Kim Edwards MRN: [id number] DOB: 1955/05/09  Today's TOC FU Call Status: Today's TOC FU Call Status:: Successful TOC FU Call Completed TOC FU Call Complete Date: 11/23/22  Transition Care Management Follow-up Telephone Call Date of Discharge: 11/22/22 Discharge Facility: Other ([facility]) Name of Other ([facility]) Discharge Facility: [facility] Type of Discharge: Emergency Department Reason for ED Visit: Other: How have you been since you were released from the hospital?: Better Any questions or concerns?: No  Items Reviewed: Did you receive and understand the discharge instructions provided?: Yes Medications obtained,verified, and reconciled?: Yes (Medications Reviewed) Any new allergies since your discharge?: No Dietary orders reviewed?: No Do you have support at home?: Yes People in Home: friend(s) Name of Support/Comfort Primary Source: Marcelino Duster  Medications Reviewed Today: Medications Reviewed Today   Medications were not reviewed in this encounter     Home Care and Equipment/Supplies: Were Home Health Services Ordered?: No Any new equipment or medical supplies ordered?: No  Functional Questionnaire: Do you need assistance with bathing/showering or dressing?: No Do you need assistance with meal preparation?: No Do you need assistance with eating?: No Do you have difficulty maintaining continence: No Do you need assistance with getting out of bed/getting out of a chair/moving?: No Do you have difficulty managing or taking your medications?: No  Follow up appointments reviewed: PCP Follow-up appointment confirmed?: NA (Patient has moved to [facility] and has a new PCP) Specialist Hospital Follow-up appointment confirmed?: NA Do you need transportation to your follow-up appointment?: No Do you understand care options if your condition(s) worsen?: Yes-patient verbalized understanding    SIGNATURE Lenard Simmer, CMA
